# Patient Record
Sex: Female | Born: 1935 | Race: White | Hispanic: No | Smoking: Never smoker
Health system: Southern US, Community
[De-identification: ages and names within clinical notes are randomized; demographics above are authoritative.]

## PROBLEM LIST (undated history)

## (undated) DIAGNOSIS — Z9221 Personal history of antineoplastic chemotherapy: Secondary | ICD-10-CM

## (undated) DIAGNOSIS — Z923 Personal history of irradiation: Secondary | ICD-10-CM

## (undated) DIAGNOSIS — I10 Essential (primary) hypertension: Secondary | ICD-10-CM

## (undated) DIAGNOSIS — F03A0 Unspecified dementia, mild, without behavioral disturbance, psychotic disturbance, mood disturbance, and anxiety: Secondary | ICD-10-CM

## (undated) DIAGNOSIS — F0390 Unspecified dementia without behavioral disturbance: Secondary | ICD-10-CM

## (undated) DIAGNOSIS — E049 Nontoxic goiter, unspecified: Secondary | ICD-10-CM

## (undated) DIAGNOSIS — I4891 Unspecified atrial fibrillation: Secondary | ICD-10-CM

## (undated) DIAGNOSIS — C50919 Malignant neoplasm of unspecified site of unspecified female breast: Secondary | ICD-10-CM

## (undated) DIAGNOSIS — C801 Malignant (primary) neoplasm, unspecified: Secondary | ICD-10-CM

## (undated) DIAGNOSIS — I509 Heart failure, unspecified: Secondary | ICD-10-CM

## (undated) HISTORY — DX: Unspecified dementia, mild, without behavioral disturbance, psychotic disturbance, mood disturbance, and anxiety: F03.A0

## (undated) HISTORY — DX: Unspecified dementia without behavioral disturbance: F03.90

## (undated) HISTORY — DX: Heart failure, unspecified: I50.9

## (undated) HISTORY — PX: BREAST SURGERY: SHX581

## (undated) HISTORY — DX: Nontoxic goiter, unspecified: E04.9

---

## 1975-01-22 HISTORY — PX: THYROID SURGERY: SHX805

## 2009-05-10 ENCOUNTER — Inpatient Hospital Stay: Payer: Self-pay | Admitting: Internal Medicine

## 2011-11-22 ENCOUNTER — Ambulatory Visit: Payer: Self-pay | Admitting: Oncology

## 2011-12-08 ENCOUNTER — Inpatient Hospital Stay: Payer: Self-pay | Admitting: Internal Medicine

## 2011-12-08 LAB — BODY FLUID CELL COUNT WITH DIFFERENTIAL
Eosinophil: 0 %
Lymphocytes: 77 %
Neutrophils: 20 %
Other Mononuclear Cells: 3 %

## 2011-12-08 LAB — PROTEIN, BODY FLUID: Protein, Body Fluid: 2.4 g/dL

## 2011-12-08 LAB — LACTATE DEHYDROGENASE, PLEURAL OR PERITONEAL FLUID: LDH, Body Fluid: 47 U/L

## 2011-12-08 LAB — BASIC METABOLIC PANEL
BUN: 11 mg/dL (ref 7–18)
Calcium, Total: 8.8 mg/dL (ref 8.5–10.1)
Co2: 28 mmol/L (ref 21–32)
Creatinine: 0.77 mg/dL (ref 0.60–1.30)
EGFR (Non-African Amer.): >60
Glucose: 127 mg/dL — ABNORMAL HIGH (ref 65–99)
Potassium: 3.8 mmol/L (ref 3.5–5.1)
Sodium: 127 mmol/L — ABNORMAL LOW (ref 136–145)

## 2011-12-08 LAB — CK TOTAL AND CKMB (NOT AT ARMC): CK, Total: 51 U/L (ref 21–215)

## 2011-12-08 LAB — TSH: Thyroid Stimulating Horm: 2.11 u[IU]/mL

## 2011-12-08 LAB — HEMOGLOBIN A1C: Hemoglobin A1C: 5.5 % (ref 4.2–6.3)

## 2011-12-08 LAB — CBC
HCT: 42.7 % (ref 35.0–47.0)
HGB: 14.6 g/dL (ref 12.0–16.0)
RBC: 4.65 10*6/uL (ref 3.80–5.20)
RDW: 13.1 % (ref 11.5–14.5)
WBC: 9.3 10*3/uL (ref 3.6–11.0)

## 2011-12-08 LAB — GLUCOSE, SEROUS FLUID: Glucose, Body Fluid: 123 mg/dL

## 2011-12-08 LAB — PRO B NATRIURETIC PEPTIDE: B-Type Natriuretic Peptide: 2681 pg/mL — ABNORMAL HIGH (ref 0–450)

## 2011-12-08 LAB — ALBUMIN, FLUID (OTHER): Body Fluid Albumin: 1.9 g/dL

## 2011-12-08 LAB — URIC ACID: Uric Acid: 3.4 mg/dL (ref 2.6–6.0)

## 2011-12-08 LAB — TROPONIN I: Troponin-I: <0.02 ng/mL

## 2011-12-09 LAB — CBC WITH DIFFERENTIAL/PLATELET
Basophil #: 0.1 10*3/uL (ref 0.0–0.1)
Eosinophil #: 0.1 10*3/uL (ref 0.0–0.7)
HCT: 39.2 % (ref 35.0–47.0)
HGB: 13.6 g/dL (ref 12.0–16.0)
Lymphocyte #: 0.9 10*3/uL — ABNORMAL LOW (ref 1.0–3.6)
Lymphocyte %: 12.9 %
MCHC: 34.6 g/dL (ref 32.0–36.0)
MCV: 91 fL (ref 80–100)
Monocyte #: 0.7 x10 3/mm (ref 0.2–0.9)
Monocyte %: 10.2 %
Neutrophil #: 5.1 10*3/uL (ref 1.4–6.5)
RBC: 4.32 10*6/uL (ref 3.80–5.20)
RDW: 13.1 % (ref 11.5–14.5)
WBC: 6.8 10*3/uL (ref 3.6–11.0)

## 2011-12-09 LAB — PROTIME-INR
INR: 1.1
Prothrombin Time: 14.1 secs (ref 11.5–14.7)

## 2011-12-09 LAB — LIPID PANEL
HDL Cholesterol: 52 mg/dL (ref 40–60)
Ldl Cholesterol, Calc: 97 mg/dL (ref 0–100)
Triglycerides: 85 mg/dL (ref 0–200)
VLDL Cholesterol, Calc: 17 mg/dL (ref 5–40)

## 2011-12-09 LAB — COMPREHENSIVE METABOLIC PANEL
Anion Gap: 6 — ABNORMAL LOW (ref 7–16)
Calcium, Total: 8.5 mg/dL (ref 8.5–10.1)
Chloride: 95 mmol/L — ABNORMAL LOW (ref 98–107)
Co2: 31 mmol/L (ref 21–32)
Creatinine: 0.87 mg/dL (ref 0.60–1.30)
EGFR (African American): >60
EGFR (Non-African Amer.): >60
Osmolality: 265 (ref 275–301)
SGOT(AST): 20 U/L (ref 15–37)
SGPT (ALT): 28 U/L (ref 12–78)
Sodium: 132 mmol/L — ABNORMAL LOW (ref 136–145)

## 2011-12-09 LAB — TROPONIN I: Troponin-I: <0.02 ng/mL

## 2011-12-09 LAB — HEMOGLOBIN A1C: Hemoglobin A1C: 5.5 % (ref 4.2–6.3)

## 2011-12-10 LAB — SODIUM, URINE, RANDOM: Sodium, Urine Random: 26 mmol/L (ref 20–110)

## 2011-12-10 LAB — OSMOLALITY, URINE: Osmolality: 203 mOsm/kg

## 2011-12-11 LAB — BASIC METABOLIC PANEL
Anion Gap: 3 — ABNORMAL LOW (ref 7–16)
BUN: 9 mg/dL (ref 7–18)
Calcium, Total: 8.7 mg/dL (ref 8.5–10.1)
Chloride: 98 mmol/L (ref 98–107)
Creatinine: 0.72 mg/dL (ref 0.60–1.30)
EGFR (African American): >60
EGFR (Non-African Amer.): >60
Glucose: 101 mg/dL — ABNORMAL HIGH (ref 65–99)
Osmolality: 267 (ref 275–301)
Potassium: 4.9 mmol/L (ref 3.5–5.1)

## 2011-12-12 LAB — BODY FLUID CULTURE

## 2011-12-16 ENCOUNTER — Ambulatory Visit: Payer: Self-pay

## 2011-12-16 ENCOUNTER — Ambulatory Visit: Payer: Self-pay | Admitting: Cardiothoracic Surgery

## 2011-12-16 LAB — CBC WITH DIFFERENTIAL/PLATELET
Basophil #: 0.1 10*3/uL (ref 0.0–0.1)
Eosinophil #: 0.1 10*3/uL (ref 0.0–0.7)
Eosinophil %: 0.8 %
HGB: 14 g/dL (ref 12.0–16.0)
Lymphocyte %: 12.3 %
MCH: 31 pg (ref 26.0–34.0)
MCHC: 33.7 g/dL (ref 32.0–36.0)
Monocyte %: 7.1 %
Neutrophil %: 79 %
Platelet: 190 10*3/uL (ref 150–440)
RBC: 4.53 10*6/uL (ref 3.80–5.20)
RDW: 13.2 % (ref 11.5–14.5)
WBC: 7 10*3/uL (ref 3.6–11.0)

## 2011-12-16 LAB — COMPREHENSIVE METABOLIC PANEL
Albumin: 4 g/dL (ref 3.4–5.0)
Alkaline Phosphatase: 107 U/L (ref 50–136)
Anion Gap: 5 — ABNORMAL LOW (ref 7–16)
BUN: 8 mg/dL (ref 7–18)
Bilirubin,Total: 0.6 mg/dL (ref 0.2–1.0)
Calcium, Total: 9.2 mg/dL (ref 8.5–10.1)
Chloride: 96 mmol/L — ABNORMAL LOW (ref 98–107)
Co2: 31 mmol/L (ref 21–32)
Creatinine: 0.66 mg/dL (ref 0.60–1.30)
EGFR (African American): >60
EGFR (Non-African Amer.): >60
Glucose: 102 mg/dL — ABNORMAL HIGH (ref 65–99)
Osmolality: 263 (ref 275–301)
Potassium: 4.6 mmol/L (ref 3.5–5.1)
SGOT(AST): 17 U/L (ref 15–37)
SGPT (ALT): 20 U/L (ref 12–78)
Sodium: 132 mmol/L — ABNORMAL LOW (ref 136–145)
Total Protein: 6.7 g/dL (ref 6.4–8.2)

## 2011-12-16 LAB — PROTIME-INR
INR: 1
Prothrombin Time: 13.2 secs (ref 11.5–14.7)

## 2011-12-16 LAB — APTT: Activated PTT: 27.2 secs (ref 23.6–35.9)

## 2011-12-17 ENCOUNTER — Emergency Department: Payer: Self-pay | Admitting: Emergency Medicine

## 2011-12-17 LAB — COMPREHENSIVE METABOLIC PANEL
Albumin: 3.6 g/dL (ref 3.4–5.0)
Alkaline Phosphatase: 93 U/L (ref 50–136)
Calcium, Total: 8.7 mg/dL (ref 8.5–10.1)
Co2: 28 mmol/L (ref 21–32)
Creatinine: 0.53 mg/dL — ABNORMAL LOW (ref 0.60–1.30)
EGFR (African American): >60
EGFR (Non-African Amer.): >60
Glucose: 106 mg/dL — ABNORMAL HIGH (ref 65–99)
Osmolality: 268 (ref 275–301)
Sodium: 133 mmol/L — ABNORMAL LOW (ref 136–145)

## 2011-12-17 LAB — CBC
HGB: 13.2 g/dL (ref 12.0–16.0)
MCHC: 34.2 g/dL (ref 32.0–36.0)
MCV: 91 fL (ref 80–100)
RBC: 4.22 10*6/uL (ref 3.80–5.20)
WBC: 7.7 10*3/uL (ref 3.6–11.0)

## 2011-12-17 LAB — CK TOTAL AND CKMB (NOT AT ARMC)
CK, Total: 97 U/L (ref 21–215)
CK, Total: 27 U/L (ref 21–215)
CK-MB: 0.5 ng/mL (ref 0.5–3.6)
CK-MB: 0.6 ng/mL (ref 0.5–3.6)

## 2011-12-17 LAB — TROPONIN I: Troponin-I: <0.02 ng/mL

## 2011-12-22 ENCOUNTER — Ambulatory Visit: Payer: Self-pay | Admitting: Oncology

## 2011-12-23 ENCOUNTER — Ambulatory Visit: Payer: Self-pay | Admitting: Oncology

## 2011-12-25 ENCOUNTER — Ambulatory Visit: Payer: Self-pay | Admitting: Oncology

## 2011-12-25 LAB — PATHOLOGY REPORT

## 2012-01-01 ENCOUNTER — Inpatient Hospital Stay: Payer: Self-pay

## 2012-01-02 LAB — BASIC METABOLIC PANEL
Anion Gap: 5 — ABNORMAL LOW (ref 7–16)
BUN: 13 mg/dL (ref 7–18)
Co2: 31 mmol/L (ref 21–32)
Creatinine: 0.81 mg/dL (ref 0.60–1.30)
Osmolality: 271 (ref 275–301)
Sodium: 134 mmol/L — ABNORMAL LOW (ref 136–145)

## 2012-01-02 LAB — CBC WITH DIFFERENTIAL/PLATELET
Eosinophil #: 0 10*3/uL (ref 0.0–0.7)
Eosinophil %: 0.3 %
HCT: 35.9 % (ref 35.0–47.0)
Lymphocyte #: 0.9 10*3/uL — ABNORMAL LOW (ref 1.0–3.6)
MCH: 31.5 pg (ref 26.0–34.0)
MCHC: 33.9 g/dL (ref 32.0–36.0)
MCV: 93 fL (ref 80–100)
Monocyte #: 1.2 x10 3/mm — ABNORMAL HIGH (ref 0.2–0.9)
Monocyte %: 11.2 %
Neutrophil #: 8.3 10*3/uL — ABNORMAL HIGH (ref 1.4–6.5)
Neutrophil %: 79.5 %
Platelet: 171 10*3/uL (ref 150–440)
RBC: 3.87 10*6/uL (ref 3.80–5.20)
RDW: 12.7 % (ref 11.5–14.5)

## 2012-01-02 LAB — PATHOLOGY REPORT

## 2012-01-22 ENCOUNTER — Ambulatory Visit: Payer: Self-pay | Admitting: Oncology

## 2012-02-12 LAB — COMPREHENSIVE METABOLIC PANEL
Anion Gap: 10 (ref 7–16)
Bilirubin,Total: 0.4 mg/dL (ref 0.2–1.0)
Chloride: 99 mmol/L (ref 98–107)
Co2: 27 mmol/L (ref 21–32)
Creatinine: 0.89 mg/dL (ref 0.60–1.30)
EGFR (African American): >60
EGFR (Non-African Amer.): >60
Glucose: 142 mg/dL — ABNORMAL HIGH (ref 65–99)
Potassium: 3.9 mmol/L (ref 3.5–5.1)
SGOT(AST): 16 U/L (ref 15–37)
SGPT (ALT): 15 U/L (ref 12–78)

## 2012-02-12 LAB — CBC CANCER CENTER
Basophil #: 0.1 x10 3/mm (ref 0.0–0.1)
Eosinophil #: 0.1 x10 3/mm (ref 0.0–0.7)
HCT: 36.7 % (ref 35.0–47.0)
Lymphocyte %: 12.3 %
MCH: 30.1 pg (ref 26.0–34.0)
MCHC: 33.3 g/dL (ref 32.0–36.0)
Monocyte #: 0.5 x10 3/mm (ref 0.2–0.9)
Neutrophil #: 5 x10 3/mm (ref 1.4–6.5)
Neutrophil %: 77.6 %
RBC: 4.06 10*6/uL (ref 3.80–5.20)
RDW: 13.5 % (ref 11.5–14.5)
WBC: 6.5 x10 3/mm (ref 3.6–11.0)

## 2012-02-19 LAB — COMPREHENSIVE METABOLIC PANEL
Albumin: 3.2 g/dL — ABNORMAL LOW (ref 3.4–5.0)
Alkaline Phosphatase: 97 U/L (ref 50–136)
Anion Gap: 6 — ABNORMAL LOW (ref 7–16)
Bilirubin,Total: 0.4 mg/dL (ref 0.2–1.0)
Chloride: 98 mmol/L (ref 98–107)
EGFR (Non-African Amer.): >60
Glucose: 96 mg/dL (ref 65–99)
Potassium: 4.4 mmol/L (ref 3.5–5.1)
SGPT (ALT): 15 U/L (ref 12–78)
Sodium: 134 mmol/L — ABNORMAL LOW (ref 136–145)
Total Protein: 5.9 g/dL — ABNORMAL LOW (ref 6.4–8.2)

## 2012-02-19 LAB — CBC CANCER CENTER
Basophil #: 0 x10 3/mm (ref 0.0–0.1)
Basophil %: 6.8 %
Eosinophil #: 0.1 x10 3/mm (ref 0.0–0.7)
Eosinophil %: 7.4 %
HCT: 32.9 % — ABNORMAL LOW (ref 35.0–47.0)
Lymphocyte #: 0.4 x10 3/mm — ABNORMAL LOW (ref 1.0–3.6)
Lymphocyte %: 62 %
MCH: 30.5 pg (ref 26.0–34.0)
MCHC: 33.8 g/dL (ref 32.0–36.0)
Monocyte #: 0.1 x10 3/mm — ABNORMAL LOW (ref 0.2–0.9)
Neutrophil %: 13.4 %
Platelet: 78 x10 3/mm — ABNORMAL LOW (ref 150–440)
RBC: 3.65 10*6/uL — ABNORMAL LOW (ref 3.80–5.20)
RDW: 12.6 % (ref 11.5–14.5)
WBC: 0.7 x10 3/mm — CL (ref 3.6–11.0)

## 2012-02-22 ENCOUNTER — Ambulatory Visit: Payer: Self-pay | Admitting: Oncology

## 2012-02-26 LAB — CBC CANCER CENTER
Basophil %: 0.4 %
Eosinophil #: 0 x10 3/mm (ref 0.0–0.7)
Eosinophil %: 0.1 %
HGB: 10.8 g/dL — ABNORMAL LOW (ref 12.0–16.0)
Lymphocyte #: 0.7 x10 3/mm — ABNORMAL LOW (ref 1.0–3.6)
MCHC: 32.7 g/dL (ref 32.0–36.0)
Monocyte #: 1 x10 3/mm — ABNORMAL HIGH (ref 0.2–0.9)
Monocyte %: 7.1 %
Neutrophil %: 87.7 %
RBC: 3.63 10*6/uL — ABNORMAL LOW (ref 3.80–5.20)
RDW: 13.5 % (ref 11.5–14.5)
WBC: 14.3 x10 3/mm — ABNORMAL HIGH (ref 3.6–11.0)

## 2012-02-26 LAB — COMPREHENSIVE METABOLIC PANEL
Albumin: 3.2 g/dL — ABNORMAL LOW (ref 3.4–5.0)
Alkaline Phosphatase: 103 U/L (ref 50–136)
Anion Gap: 11 (ref 7–16)
Bilirubin,Total: 0.2 mg/dL (ref 0.2–1.0)
Calcium, Total: 8.2 mg/dL — ABNORMAL LOW (ref 8.5–10.1)
Creatinine: 0.89 mg/dL (ref 0.60–1.30)
EGFR (African American): >60
EGFR (Non-African Amer.): >60
Glucose: 145 mg/dL — ABNORMAL HIGH (ref 65–99)
SGOT(AST): 17 U/L (ref 15–37)
SGPT (ALT): 14 U/L (ref 12–78)
Sodium: 136 mmol/L (ref 136–145)

## 2012-03-04 LAB — CBC CANCER CENTER
Bands: 3 %
HCT: 30.5 % — ABNORMAL LOW (ref 35.0–47.0)
HGB: 10.4 g/dL — ABNORMAL LOW (ref 12.0–16.0)
MCH: 30.9 pg (ref 26.0–34.0)
MCV: 91 fL (ref 80–100)
Monocytes: 4 %
Other Cells Blood: 1 %
RBC: 3.36 10*6/uL — ABNORMAL LOW (ref 3.80–5.20)

## 2012-03-11 LAB — CBC CANCER CENTER
Basophil #: 0 x10 3/mm (ref 0.0–0.1)
Basophil %: 0.2 %
Eosinophil %: 0.3 %
HCT: 29.8 % — ABNORMAL LOW (ref 35.0–47.0)
HGB: 10 g/dL — ABNORMAL LOW (ref 12.0–16.0)
Lymphocyte #: 0.6 x10 3/mm — ABNORMAL LOW (ref 1.0–3.6)
Lymphocyte %: 3.3 %
MCV: 91 fL (ref 80–100)
Monocyte #: 1.1 x10 3/mm — ABNORMAL HIGH (ref 0.2–0.9)
Neutrophil #: 15.2 x10 3/mm — ABNORMAL HIGH (ref 1.4–6.5)
Neutrophil %: 89.7 %
RBC: 3.29 10*6/uL — ABNORMAL LOW (ref 3.80–5.20)
RDW: 14.4 % (ref 11.5–14.5)

## 2012-03-11 LAB — COMPREHENSIVE METABOLIC PANEL
Anion Gap: 9 (ref 7–16)
Bilirubin,Total: 0.1 mg/dL — ABNORMAL LOW (ref 0.2–1.0)
Calcium, Total: 8.5 mg/dL (ref 8.5–10.1)
Chloride: 100 mmol/L (ref 98–107)
Creatinine: 0.88 mg/dL (ref 0.60–1.30)
EGFR (African American): >60
Glucose: 130 mg/dL — ABNORMAL HIGH (ref 65–99)
Potassium: 3.8 mmol/L (ref 3.5–5.1)
SGPT (ALT): 14 U/L (ref 12–78)
Total Protein: 5.9 g/dL — ABNORMAL LOW (ref 6.4–8.2)

## 2012-03-18 LAB — CBC CANCER CENTER
Basophil #: 0 x10 3/mm (ref 0.0–0.1)
Eosinophil #: 0 x10 3/mm (ref 0.0–0.7)
HCT: 26.8 % — ABNORMAL LOW (ref 35.0–47.0)
HGB: 9.1 g/dL — ABNORMAL LOW (ref 12.0–16.0)
Lymphocyte #: 0.1 x10 3/mm — ABNORMAL LOW (ref 1.0–3.6)
MCH: 31.2 pg (ref 26.0–34.0)
MCHC: 34.1 g/dL (ref 32.0–36.0)
Monocyte #: 0.1 x10 3/mm — ABNORMAL LOW (ref 0.2–0.9)
Monocyte %: 22.8 %
RBC: 2.93 10*6/uL — ABNORMAL LOW (ref 3.80–5.20)
RDW: 15.2 % — ABNORMAL HIGH (ref 11.5–14.5)

## 2012-03-21 ENCOUNTER — Ambulatory Visit: Payer: Self-pay | Admitting: Oncology

## 2012-03-25 LAB — CBC CANCER CENTER
Basophil #: 0.1 x10 3/mm (ref 0.0–0.1)
Basophil %: 0.4 %
Eosinophil #: 0 x10 3/mm (ref 0.0–0.7)
HCT: 27.8 % — ABNORMAL LOW (ref 35.0–47.0)
Lymphocyte #: 0.3 x10 3/mm — ABNORMAL LOW (ref 1.0–3.6)
Lymphocyte %: 1.5 %
MCHC: 33.9 g/dL (ref 32.0–36.0)
Monocyte #: 1.2 x10 3/mm — ABNORMAL HIGH (ref 0.2–0.9)
Neutrophil #: 19.3 x10 3/mm — ABNORMAL HIGH (ref 1.4–6.5)

## 2012-03-25 LAB — COMPREHENSIVE METABOLIC PANEL
Alkaline Phosphatase: 95 U/L (ref 50–136)
Anion Gap: 10 (ref 7–16)
Calcium, Total: 8.5 mg/dL (ref 8.5–10.1)
Chloride: 97 mmol/L — ABNORMAL LOW (ref 98–107)
Creatinine: 0.91 mg/dL (ref 0.60–1.30)
EGFR (African American): >60
EGFR (Non-African Amer.): >60
Osmolality: 272 (ref 275–301)
Potassium: 3.7 mmol/L (ref 3.5–5.1)
Sodium: 136 mmol/L (ref 136–145)
Total Protein: 5.9 g/dL — ABNORMAL LOW (ref 6.4–8.2)

## 2012-04-15 LAB — CBC CANCER CENTER
Basophil #: 0.1 x10 3/mm (ref 0.0–0.1)
Basophil %: 1 %
Eosinophil #: 0 x10 3/mm (ref 0.0–0.7)
Lymphocyte %: 2.3 %
MCV: 93 fL (ref 80–100)
Monocyte %: 11 %
Neutrophil #: 10 x10 3/mm — ABNORMAL HIGH (ref 1.4–6.5)
Platelet: 209 x10 3/mm (ref 150–440)
RBC: 2.61 10*6/uL — ABNORMAL LOW (ref 3.80–5.20)
RDW: 19.9 % — ABNORMAL HIGH (ref 11.5–14.5)
WBC: 11.6 x10 3/mm — ABNORMAL HIGH (ref 3.6–11.0)

## 2012-04-15 LAB — COMPREHENSIVE METABOLIC PANEL
Alkaline Phosphatase: 74 U/L (ref 50–136)
Anion Gap: 10 (ref 7–16)
BUN: 14 mg/dL (ref 7–18)
Calcium, Total: 8.4 mg/dL — ABNORMAL LOW (ref 8.5–10.1)
Chloride: 96 mmol/L — ABNORMAL LOW (ref 98–107)
Co2: 27 mmol/L (ref 21–32)
Creatinine: 0.87 mg/dL (ref 0.60–1.30)
EGFR (Non-African Amer.): >60
Glucose: 126 mg/dL — ABNORMAL HIGH (ref 65–99)
Osmolality: 268 (ref 275–301)
Potassium: 4.3 mmol/L (ref 3.5–5.1)
SGOT(AST): 46 U/L — ABNORMAL HIGH (ref 15–37)
SGPT (ALT): 60 U/L (ref 12–78)
Total Protein: 5.5 g/dL — ABNORMAL LOW (ref 6.4–8.2)

## 2012-04-21 ENCOUNTER — Ambulatory Visit: Payer: Self-pay | Admitting: Oncology

## 2012-04-22 LAB — COMPREHENSIVE METABOLIC PANEL
BUN: 12 mg/dL (ref 7–18)
Creatinine: 0.79 mg/dL (ref 0.60–1.30)
EGFR (Non-African Amer.): >60
Glucose: 118 mg/dL — ABNORMAL HIGH (ref 65–99)
Osmolality: 269 (ref 275–301)
SGOT(AST): 21 U/L (ref 15–37)
SGPT (ALT): 27 U/L (ref 12–78)
Sodium: 134 mmol/L — ABNORMAL LOW (ref 136–145)

## 2012-04-22 LAB — CBC CANCER CENTER
Basophil %: 1.3 %
Eosinophil #: 0.1 x10 3/mm (ref 0.0–0.7)
Eosinophil %: 1.4 %
HCT: 27.6 % — ABNORMAL LOW (ref 35.0–47.0)
HGB: 9.2 g/dL — ABNORMAL LOW (ref 12.0–16.0)
MCH: 31 pg (ref 26.0–34.0)
MCV: 93 fL (ref 80–100)
Monocyte %: 8.5 %
Neutrophil #: 7.5 x10 3/mm — ABNORMAL HIGH (ref 1.4–6.5)
Neutrophil %: 86.2 %
RBC: 2.96 10*6/uL — ABNORMAL LOW (ref 3.80–5.20)
RDW: 18.7 % — ABNORMAL HIGH (ref 11.5–14.5)

## 2012-04-29 LAB — COMPREHENSIVE METABOLIC PANEL
Albumin: 2.8 g/dL — ABNORMAL LOW (ref 3.4–5.0)
Alkaline Phosphatase: 61 U/L (ref 50–136)
Anion Gap: 12 (ref 7–16)
Bilirubin,Total: 0.7 mg/dL (ref 0.2–1.0)
Calcium, Total: 8.2 mg/dL — ABNORMAL LOW (ref 8.5–10.1)
EGFR (African American): >60
Osmolality: 273 (ref 275–301)
Potassium: 3.6 mmol/L (ref 3.5–5.1)
SGPT (ALT): 19 U/L (ref 12–78)
Total Protein: 5.5 g/dL — ABNORMAL LOW (ref 6.4–8.2)

## 2012-04-29 LAB — CBC CANCER CENTER
Basophil #: 0.1 x10 3/mm (ref 0.0–0.1)
Basophil %: 1.3 %
Eosinophil #: 0.2 x10 3/mm (ref 0.0–0.7)
HCT: 27.1 % — ABNORMAL LOW (ref 35.0–47.0)
HGB: 9.2 g/dL — ABNORMAL LOW (ref 12.0–16.0)
MCHC: 33.8 g/dL (ref 32.0–36.0)
MCV: 94 fL (ref 80–100)
Monocyte #: 0.7 x10 3/mm (ref 0.2–0.9)
Neutrophil %: 78.1 %
Platelet: 154 x10 3/mm (ref 150–440)
RBC: 2.89 10*6/uL — ABNORMAL LOW (ref 3.80–5.20)
RDW: 19.3 % — ABNORMAL HIGH (ref 11.5–14.5)
WBC: 6 x10 3/mm (ref 3.6–11.0)

## 2012-05-06 LAB — COMPREHENSIVE METABOLIC PANEL
Albumin: 2.9 g/dL — ABNORMAL LOW (ref 3.4–5.0)
Alkaline Phosphatase: 59 U/L (ref 50–136)
BUN: 10 mg/dL (ref 7–18)
Bilirubin,Total: 0.6 mg/dL (ref 0.2–1.0)
Calcium, Total: 8.4 mg/dL — ABNORMAL LOW (ref 8.5–10.1)
Chloride: 96 mmol/L — ABNORMAL LOW (ref 98–107)
Creatinine: 0.78 mg/dL (ref 0.60–1.30)
EGFR (African American): >60
Osmolality: 270 (ref 275–301)
Potassium: 3.4 mmol/L — ABNORMAL LOW (ref 3.5–5.1)
SGOT(AST): 22 U/L (ref 15–37)
SGPT (ALT): 20 U/L (ref 12–78)
Sodium: 135 mmol/L — ABNORMAL LOW (ref 136–145)
Total Protein: 5.6 g/dL — ABNORMAL LOW (ref 6.4–8.2)

## 2012-05-06 LAB — CBC CANCER CENTER
Basophil #: 0.1 x10 3/mm (ref 0.0–0.1)
Basophil %: 1.4 %
HCT: 26 % — ABNORMAL LOW (ref 35.0–47.0)
Lymphocyte #: 0.2 x10 3/mm — ABNORMAL LOW (ref 1.0–3.6)
Lymphocyte %: 4.3 %
MCH: 32 pg (ref 26.0–34.0)
MCHC: 33.6 g/dL (ref 32.0–36.0)
MCV: 95 fL (ref 80–100)
Monocyte #: 0.5 x10 3/mm (ref 0.2–0.9)
Monocyte %: 11.6 %
Platelet: 116 x10 3/mm — ABNORMAL LOW (ref 150–440)
RDW: 20 % — ABNORMAL HIGH (ref 11.5–14.5)
WBC: 4.5 x10 3/mm (ref 3.6–11.0)

## 2012-05-13 LAB — CBC CANCER CENTER
Basophil #: 0.1 x10 3/mm (ref 0.0–0.1)
Eosinophil #: 0.1 x10 3/mm (ref 0.0–0.7)
Eosinophil %: 1.5 %
HCT: 25.8 % — ABNORMAL LOW (ref 35.0–47.0)
HGB: 8.7 g/dL — ABNORMAL LOW (ref 12.0–16.0)
MCHC: 33.5 g/dL (ref 32.0–36.0)
MCV: 95 fL (ref 80–100)
Monocyte #: 0.5 x10 3/mm (ref 0.2–0.9)
Neutrophil #: 3.1 x10 3/mm (ref 1.4–6.5)
Neutrophil %: 77.4 %
Platelet: 138 x10 3/mm — ABNORMAL LOW (ref 150–440)
RBC: 2.71 10*6/uL — ABNORMAL LOW (ref 3.80–5.20)
WBC: 4 x10 3/mm (ref 3.6–11.0)

## 2012-05-13 LAB — COMPREHENSIVE METABOLIC PANEL
Albumin: 2.7 g/dL — ABNORMAL LOW (ref 3.4–5.0)
Alkaline Phosphatase: 56 U/L (ref 50–136)
Bilirubin,Total: 0.5 mg/dL (ref 0.2–1.0)
Chloride: 96 mmol/L — ABNORMAL LOW (ref 98–107)
Co2: 33 mmol/L — ABNORMAL HIGH (ref 21–32)
EGFR (African American): >60
EGFR (Non-African Amer.): >60
Glucose: 135 mg/dL — ABNORMAL HIGH (ref 65–99)
Potassium: 3.9 mmol/L (ref 3.5–5.1)
Sodium: 133 mmol/L — ABNORMAL LOW (ref 136–145)
Total Protein: 5.5 g/dL — ABNORMAL LOW (ref 6.4–8.2)

## 2012-05-20 LAB — CBC CANCER CENTER
Basophil %: 1.4 %
Eosinophil #: 0 x10 3/mm (ref 0.0–0.7)
Eosinophil %: 1.2 %
HGB: 8.9 g/dL — ABNORMAL LOW (ref 12.0–16.0)
Lymphocyte #: 0.3 x10 3/mm — ABNORMAL LOW (ref 1.0–3.6)
Lymphocyte %: 8.2 %
Monocyte #: 0.4 x10 3/mm (ref 0.2–0.9)
RDW: 20.1 % — ABNORMAL HIGH (ref 11.5–14.5)
WBC: 3.2 x10 3/mm — ABNORMAL LOW (ref 3.6–11.0)

## 2012-05-20 LAB — COMPREHENSIVE METABOLIC PANEL
Albumin: 3 g/dL — ABNORMAL LOW (ref 3.4–5.0)
Alkaline Phosphatase: 55 U/L (ref 50–136)
Anion Gap: 7 (ref 7–16)
Calcium, Total: 8.6 mg/dL (ref 8.5–10.1)
Chloride: 97 mmol/L — ABNORMAL LOW (ref 98–107)
Co2: 33 mmol/L — ABNORMAL HIGH (ref 21–32)
Glucose: 127 mg/dL — ABNORMAL HIGH (ref 65–99)
Osmolality: 275 (ref 275–301)
Potassium: 4 mmol/L (ref 3.5–5.1)
SGOT(AST): 23 U/L (ref 15–37)

## 2012-05-21 ENCOUNTER — Ambulatory Visit: Payer: Self-pay | Admitting: Oncology

## 2012-05-27 LAB — CBC CANCER CENTER
Basophil %: 1.3 %
Eosinophil #: 0 x10 3/mm (ref 0.0–0.7)
Eosinophil %: 1.2 %
HGB: 8.4 g/dL — ABNORMAL LOW (ref 12.0–16.0)
Lymphocyte #: 0.4 x10 3/mm — ABNORMAL LOW (ref 1.0–3.6)
MCH: 32.1 pg (ref 26.0–34.0)
Monocyte #: 0.5 x10 3/mm (ref 0.2–0.9)
Monocyte %: 11.7 %
Neutrophil %: 76.3 %
Platelet: 161 x10 3/mm (ref 150–440)
RDW: 20.1 % — ABNORMAL HIGH (ref 11.5–14.5)

## 2012-05-27 LAB — COMPREHENSIVE METABOLIC PANEL
Alkaline Phosphatase: 50 U/L (ref 50–136)
Anion Gap: 6 — ABNORMAL LOW (ref 7–16)
Bilirubin,Total: 0.5 mg/dL (ref 0.2–1.0)
Calcium, Total: 8.7 mg/dL (ref 8.5–10.1)
Chloride: 98 mmol/L (ref 98–107)
Co2: 32 mmol/L (ref 21–32)
Creatinine: 0.8 mg/dL (ref 0.60–1.30)
EGFR (African American): >60
EGFR (Non-African Amer.): >60
Glucose: 132 mg/dL — ABNORMAL HIGH (ref 65–99)
Sodium: 136 mmol/L (ref 136–145)

## 2012-06-03 LAB — CBC CANCER CENTER
Basophil #: 0 x10 3/mm (ref 0.0–0.1)
Basophil %: 0.4 %
HCT: 25.2 % — ABNORMAL LOW (ref 35.0–47.0)
Lymphocyte #: 0.5 x10 3/mm — ABNORMAL LOW (ref 1.0–3.6)
MCHC: 32.6 g/dL (ref 32.0–36.0)
MCV: 96 fL (ref 80–100)
Monocyte #: 0.5 x10 3/mm (ref 0.2–0.9)
Monocyte %: 12.6 %
Platelet: 167 x10 3/mm (ref 150–440)
RDW: 20.4 % — ABNORMAL HIGH (ref 11.5–14.5)
WBC: 3.7 x10 3/mm (ref 3.6–11.0)

## 2012-06-03 LAB — COMPREHENSIVE METABOLIC PANEL
Albumin: 3 g/dL — ABNORMAL LOW (ref 3.4–5.0)
Alkaline Phosphatase: 57 U/L (ref 50–136)
Anion Gap: 8 (ref 7–16)
BUN: 8 mg/dL (ref 7–18)
Bilirubin,Total: 0.4 mg/dL (ref 0.2–1.0)
Calcium, Total: 8.5 mg/dL (ref 8.5–10.1)
Chloride: 99 mmol/L (ref 98–107)
Co2: 32 mmol/L (ref 21–32)
Creatinine: 0.75 mg/dL (ref 0.60–1.30)
EGFR (African American): >60
EGFR (Non-African Amer.): >60
Glucose: 119 mg/dL — ABNORMAL HIGH (ref 65–99)
Osmolality: 277 (ref 275–301)
Potassium: 3.5 mmol/L (ref 3.5–5.1)
SGOT(AST): 21 U/L (ref 15–37)
SGPT (ALT): 20 U/L (ref 12–78)
Sodium: 139 mmol/L (ref 136–145)
Total Protein: 5.6 g/dL — ABNORMAL LOW (ref 6.4–8.2)

## 2012-06-10 LAB — COMPREHENSIVE METABOLIC PANEL
Alkaline Phosphatase: 57 U/L (ref 50–136)
Anion Gap: 8 (ref 7–16)
Bilirubin,Total: 0.5 mg/dL (ref 0.2–1.0)
Calcium, Total: 8.5 mg/dL (ref 8.5–10.1)
Chloride: 97 mmol/L — ABNORMAL LOW (ref 98–107)
Creatinine: 0.73 mg/dL (ref 0.60–1.30)
EGFR (African American): >60
Potassium: 3.8 mmol/L (ref 3.5–5.1)
SGPT (ALT): 21 U/L (ref 12–78)
Sodium: 136 mmol/L (ref 136–145)

## 2012-06-10 LAB — CBC CANCER CENTER
Basophil #: 0 x10 3/mm (ref 0.0–0.1)
Eosinophil #: 0 x10 3/mm (ref 0.0–0.7)
Eosinophil %: 1.2 %
HCT: 24.9 % — ABNORMAL LOW (ref 35.0–47.0)
HGB: 8.3 g/dL — ABNORMAL LOW (ref 12.0–16.0)
Lymphocyte #: 1 x10 3/mm (ref 1.0–3.6)
Lymphocyte %: 23.6 %
Monocyte #: 0.4 x10 3/mm (ref 0.2–0.9)
Neutrophil #: 2.6 x10 3/mm (ref 1.4–6.5)
Neutrophil %: 63.2 %
RBC: 2.6 10*6/uL — ABNORMAL LOW (ref 3.80–5.20)
RDW: 20.9 % — ABNORMAL HIGH (ref 11.5–14.5)

## 2012-06-17 LAB — CBC CANCER CENTER
Eosinophil #: 0 x10 3/mm (ref 0.0–0.7)
Eosinophil %: 1.2 %
HGB: 8.4 g/dL — ABNORMAL LOW (ref 12.0–16.0)
Lymphocyte %: 17.3 %
MCHC: 33.6 g/dL (ref 32.0–36.0)
MCV: 96 fL (ref 80–100)
Monocyte #: 0.5 x10 3/mm (ref 0.2–0.9)
Neutrophil #: 2.9 x10 3/mm (ref 1.4–6.5)
Neutrophil %: 68.8 %
RDW: 20.9 % — ABNORMAL HIGH (ref 11.5–14.5)
WBC: 4.2 x10 3/mm (ref 3.6–11.0)

## 2012-06-17 LAB — COMPREHENSIVE METABOLIC PANEL
Albumin: 3 g/dL — ABNORMAL LOW (ref 3.4–5.0)
Alkaline Phosphatase: 51 U/L (ref 50–136)
BUN: 14 mg/dL (ref 7–18)
Bilirubin,Total: 0.5 mg/dL (ref 0.2–1.0)
Glucose: 117 mg/dL — ABNORMAL HIGH (ref 65–99)
Osmolality: 277 (ref 275–301)
Total Protein: 5.5 g/dL — ABNORMAL LOW (ref 6.4–8.2)

## 2012-06-21 ENCOUNTER — Ambulatory Visit: Payer: Self-pay | Admitting: Oncology

## 2012-06-24 LAB — CBC CANCER CENTER
Basophil #: 0.1 x10 3/mm (ref 0.0–0.1)
Eosinophil #: 0 x10 3/mm (ref 0.0–0.7)
Eosinophil %: 0.8 %
HCT: 25.4 % — ABNORMAL LOW (ref 35.0–47.0)
Lymphocyte %: 19 %
MCH: 32.2 pg (ref 26.0–34.0)
MCHC: 32.9 g/dL (ref 32.0–36.0)
Monocyte %: 12.3 %

## 2012-06-24 LAB — COMPREHENSIVE METABOLIC PANEL
Albumin: 3 g/dL — ABNORMAL LOW (ref 3.4–5.0)
Alkaline Phosphatase: 56 U/L (ref 50–136)
Anion Gap: 9 (ref 7–16)
Calcium, Total: 8.6 mg/dL (ref 8.5–10.1)
Creatinine: 0.72 mg/dL (ref 0.60–1.30)
EGFR (African American): >60
EGFR (Non-African Amer.): >60
Glucose: 116 mg/dL — ABNORMAL HIGH (ref 65–99)
Potassium: 4.2 mmol/L (ref 3.5–5.1)
SGOT(AST): 24 U/L (ref 15–37)
Sodium: 138 mmol/L (ref 136–145)

## 2012-07-01 LAB — COMPREHENSIVE METABOLIC PANEL
Anion Gap: 7 (ref 7–16)
BUN: 10 mg/dL (ref 7–18)
Bilirubin,Total: 0.4 mg/dL (ref 0.2–1.0)
EGFR (African American): >60
EGFR (Non-African Amer.): >60
Glucose: 113 mg/dL — ABNORMAL HIGH (ref 65–99)
SGPT (ALT): 20 U/L (ref 12–78)
Sodium: 139 mmol/L (ref 136–145)
Total Protein: 5.5 g/dL — ABNORMAL LOW (ref 6.4–8.2)

## 2012-07-01 LAB — CBC CANCER CENTER
Basophil #: 0.1 x10 3/mm (ref 0.0–0.1)
Basophil %: 1.4 %
Eosinophil #: 0 x10 3/mm (ref 0.0–0.7)
Eosinophil %: 1 %
HCT: 25.8 % — ABNORMAL LOW (ref 35.0–47.0)
HGB: 8.7 g/dL — ABNORMAL LOW (ref 12.0–16.0)
Lymphocyte #: 0.8 x10 3/mm — ABNORMAL LOW (ref 1.0–3.6)
Lymphocyte %: 16.3 %
MCH: 33.4 pg (ref 26.0–34.0)
Monocyte #: 0.6 x10 3/mm (ref 0.2–0.9)
Monocyte %: 10.7 %
Neutrophil #: 3.6 x10 3/mm (ref 1.4–6.5)
Neutrophil %: 70.6 %
Platelet: 180 x10 3/mm (ref 150–440)
RDW: 21.2 % — ABNORMAL HIGH (ref 11.5–14.5)
WBC: 5.2 x10 3/mm (ref 3.6–11.0)

## 2012-07-21 ENCOUNTER — Ambulatory Visit: Payer: Self-pay | Admitting: Oncology

## 2012-08-21 DIAGNOSIS — C50919 Malignant neoplasm of unspecified site of unspecified female breast: Secondary | ICD-10-CM

## 2012-08-21 HISTORY — DX: Malignant neoplasm of unspecified site of unspecified female breast: C50.919

## 2012-08-21 HISTORY — PX: MASTECTOMY: SHX3

## 2012-08-25 ENCOUNTER — Ambulatory Visit: Payer: Self-pay | Admitting: Surgery

## 2012-08-25 LAB — CBC WITH DIFFERENTIAL/PLATELET
Basophil %: 0.8 %
Eosinophil #: 0.1 10*3/uL (ref 0.0–0.7)
Eosinophil %: 1.6 %
HGB: 12.1 g/dL (ref 12.0–16.0)
Lymphocyte #: 1.5 10*3/uL (ref 1.0–3.6)
MCH: 31.4 pg (ref 26.0–34.0)
MCV: 92 fL (ref 80–100)
Monocyte %: 10.5 %
Neutrophil %: 62.4 %

## 2012-08-25 LAB — BASIC METABOLIC PANEL
BUN: 8 mg/dL (ref 7–18)
Chloride: 102 mmol/L (ref 98–107)
Co2: 34 mmol/L — ABNORMAL HIGH (ref 21–32)
Creatinine: 0.67 mg/dL (ref 0.60–1.30)
EGFR (Non-African Amer.): >60
Glucose: 86 mg/dL (ref 65–99)
Osmolality: 273 (ref 275–301)
Sodium: 138 mmol/L (ref 136–145)

## 2012-09-01 ENCOUNTER — Ambulatory Visit: Payer: Self-pay | Admitting: Surgery

## 2012-10-01 ENCOUNTER — Ambulatory Visit: Payer: Self-pay | Admitting: Oncology

## 2012-10-01 LAB — COMPREHENSIVE METABOLIC PANEL
Albumin: 3.6 g/dL (ref 3.4–5.0)
Bilirubin,Total: 0.3 mg/dL (ref 0.2–1.0)
Calcium, Total: 9.4 mg/dL (ref 8.5–10.1)
Chloride: 101 mmol/L (ref 98–107)
Co2: 32 mmol/L (ref 21–32)
EGFR (African American): >60
EGFR (Non-African Amer.): >60
Glucose: 112 mg/dL — ABNORMAL HIGH (ref 65–99)
Osmolality: 280 (ref 275–301)
Potassium: 3.9 mmol/L (ref 3.5–5.1)
Sodium: 141 mmol/L (ref 136–145)
Total Protein: 6.5 g/dL (ref 6.4–8.2)

## 2012-10-01 LAB — CBC CANCER CENTER
Basophil #: 0.1 x10 3/mm (ref 0.0–0.1)
Basophil %: 1.1 %
Eosinophil #: 0.1 x10 3/mm (ref 0.0–0.7)
Eosinophil %: 1.6 %
HGB: 12.4 g/dL (ref 12.0–16.0)
Lymphocyte #: 1.3 x10 3/mm (ref 1.0–3.6)
Lymphocyte %: 20.9 %
MCH: 29.4 pg (ref 26.0–34.0)
Monocyte #: 0.6 x10 3/mm (ref 0.2–0.9)
Monocyte %: 9.5 %
Neutrophil #: 4 x10 3/mm (ref 1.4–6.5)
Platelet: 181 x10 3/mm (ref 150–440)

## 2012-10-08 ENCOUNTER — Ambulatory Visit: Payer: Self-pay | Admitting: Surgery

## 2012-10-21 ENCOUNTER — Ambulatory Visit: Payer: Self-pay | Admitting: Oncology

## 2012-11-13 LAB — CBC CANCER CENTER
Basophil #: 0 x10 3/mm (ref 0.0–0.1)
Eosinophil %: 1.4 %
HCT: 37.7 % (ref 35.0–47.0)
HGB: 12.6 g/dL (ref 12.0–16.0)
Lymphocyte #: 0.8 x10 3/mm — ABNORMAL LOW (ref 1.0–3.6)
MCV: 88 fL (ref 80–100)
Neutrophil #: 3.6 x10 3/mm (ref 1.4–6.5)

## 2012-11-20 LAB — CBC CANCER CENTER
Basophil %: 1.1 %
Eosinophil %: 1.2 %
Lymphocyte #: 0.7 x10 3/mm — ABNORMAL LOW (ref 1.0–3.6)
MCH: 28.8 pg (ref 26.0–34.0)
Monocyte #: 0.5 x10 3/mm (ref 0.2–0.9)
Monocyte %: 10.7 %
Neutrophil %: 71.6 %
RBC: 4.4 10*6/uL (ref 3.80–5.20)
RDW: 14.8 % — ABNORMAL HIGH (ref 11.5–14.5)
WBC: 4.8 x10 3/mm (ref 3.6–11.0)

## 2012-11-21 ENCOUNTER — Ambulatory Visit: Payer: Self-pay | Admitting: Oncology

## 2012-11-27 LAB — CBC CANCER CENTER
Basophil %: 0.8 %
Eosinophil #: 0.1 x10 3/mm (ref 0.0–0.7)
Eosinophil %: 1.6 %
MCH: 28.8 pg (ref 26.0–34.0)
MCHC: 32.4 g/dL (ref 32.0–36.0)
MCV: 89 fL (ref 80–100)
Monocyte #: 0.6 x10 3/mm (ref 0.2–0.9)
Monocyte %: 11 %
Neutrophil #: 3.7 x10 3/mm (ref 1.4–6.5)
Neutrophil %: 71.5 %
RBC: 4.32 10*6/uL (ref 3.80–5.20)
RDW: 14.9 % — ABNORMAL HIGH (ref 11.5–14.5)
WBC: 5.2 x10 3/mm (ref 3.6–11.0)

## 2012-12-04 LAB — CBC CANCER CENTER
Basophil %: 1 %
HCT: 38.8 % (ref 35.0–47.0)
Lymphocyte #: 0.7 x10 3/mm — ABNORMAL LOW (ref 1.0–3.6)
Lymphocyte %: 14.7 %
MCHC: 32.2 g/dL (ref 32.0–36.0)
Neutrophil %: 70.4 %
RDW: 15.1 % — ABNORMAL HIGH (ref 11.5–14.5)

## 2012-12-11 LAB — CBC CANCER CENTER
Basophil #: 0 "x10 3/mm "
Basophil %: 0.8 %
Eosinophil #: 0.1 "x10 3/mm "
Eosinophil %: 1.3 %
HCT: 37.9 %
HGB: 12.4 g/dL
Lymphocyte %: 12.5 %
Lymphs Abs: 0.6 "x10 3/mm " — ABNORMAL LOW
MCH: 29.1 pg
MCHC: 32.8 g/dL
MCV: 89 fL
Monocyte #: 0.5 "x10 3/mm "
Monocyte %: 11.4 %
Neutrophil #: 3.3 "x10 3/mm "
Neutrophil %: 74 %
Platelet: 156 "x10 3/mm "
RBC: 4.27 "x10 6/mm "
RDW: 15.3 % — ABNORMAL HIGH
WBC: 4.5 "x10 3/mm "

## 2012-12-21 ENCOUNTER — Ambulatory Visit: Payer: Self-pay | Admitting: Oncology

## 2013-01-11 ENCOUNTER — Ambulatory Visit: Payer: Self-pay | Admitting: Surgery

## 2013-01-21 ENCOUNTER — Ambulatory Visit: Payer: Self-pay | Admitting: Oncology

## 2013-03-31 ENCOUNTER — Ambulatory Visit: Payer: Self-pay | Admitting: Oncology

## 2013-04-01 LAB — CANCER ANTIGEN 27.29: CA 27.29: 23.1 U/mL (ref 0.0–38.6)

## 2013-04-06 DIAGNOSIS — C50919 Malignant neoplasm of unspecified site of unspecified female breast: Secondary | ICD-10-CM | POA: Insufficient documentation

## 2013-04-06 DIAGNOSIS — E782 Mixed hyperlipidemia: Secondary | ICD-10-CM | POA: Insufficient documentation

## 2013-04-21 ENCOUNTER — Ambulatory Visit: Payer: Self-pay | Admitting: Oncology

## 2013-07-02 ENCOUNTER — Ambulatory Visit: Payer: Self-pay | Admitting: Radiation Oncology

## 2013-07-06 IMAGING — PT NM PET TUM IMG RESTAG (PS) SKULL BASE T - THIGH
1 of 5 series · 1 of 25 positions shown · non-contrast
Comparison: none

REASON FOR EXAM: Initial stage breast cancer
COMMENTS:

PROCEDURE:     PET - PET/CT RESTG BREAST CA  - December 25, 2011 [DATE]
RESULT:     Indication: Breast cancer
Radiopharmaceutical: 12.02 mCi F18-FDG, intravenously.
TECHNIQUE: Imaging was performed from the skull base to the mid-thigh using
routine PET/CT acquisition protocol.
Injection site: left antecubital
Time of FDG injection: 0379 hours
Serum glucose: 115 mg/dL
Time of imaging: 4143 hours
Comparison studies: NONE

[Series 3: ct wb 3.0 b30f · axial · 3.0mm · 0.98mm/px · 1 of 435 slices shown]
[im 435/435  brain]
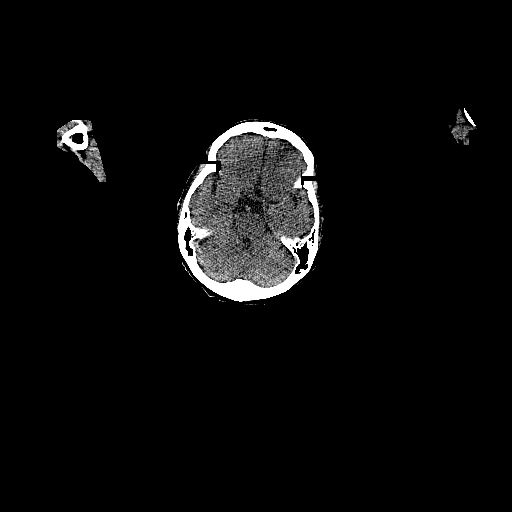

[1 of 25 positions shown; findings below may reference images not displayed]

FINDINGS: HEAD AND NECK:

There is abnormal hypermetabolic activity within a 12 mm thyroid nodule in
the isthmus of the thyroid gland with an SUV max of 4.7 and an SUV average
of 2.6 .

CHEST:

There is a moderate right pleural effusion. There is a large substernal
thyroid gland again noted measuring approximately 9.4 x 1.7 x 10 cm without
significant metabolic activity. Again noted is a spiculated 4.4 cm
hypermetabolic left breast mass with overlying skin thickening with an SUV
max of 5.3 and an SUV average of 3.2 .

The heart size is normal. There is no pericardial effusion.

There no pathologically enlarged mediastinal, hilar, or axillary lymph
nodes.

The osseous structures demonstrate no focal abnormality.

ABDOMEN/PELVIS:

The liver demonstrates no focal abnormality. There is cholelithiasis.. The
spleen demonstrates no focal abnormality. The kidneys, adrenal glands,
pancreas are normal. The bladder is unremarkable.

The unopacified bowel is unremarkable. There is no pneumoperitoneum,
pneumatosis, or portal venous gas. There is no abdominal or pelvic free
fluid. There is no lymphadenopathy.

The abdominal aorta is normal in caliber.

The osseous structures are unremarkable.
IMPRESSION: 1. Hypermetabolic left breast mass most consistent with malignancy. There
are no distant foci of hypermetabolic activity to suggest metastatic disease.

2. There is abnormal hypermetabolic activity within a 12 mm thyroid nodule
in the isthmus of the thyroid gland. A differential diagnoses includes
hyperfunctioning nodule versus thyroid cancer. Recommend further evaluation
with ultrasound.

3. Nonspecific moderate right pleural effusion.

[REDACTED]

## 2013-07-09 LAB — CANCER ANTIGEN 27.29: CA 27.29: 27.8 U/mL (ref 0.0–38.6)

## 2013-07-13 IMAGING — CR DG CHEST 1V PORT
1 series · 2 of 2 positions shown · non-contrast
Comparison: none

REASON FOR EXAM: f/u thoracoscopy, left subclavian [REDACTED]
COMMENTS:

PROCEDURE:     DXR - DXR PORTABLE CHEST SINGLE VIEW  - January 01, 2012 [DATE]
RESULT:
Comparison is made to a prior study dated 12/17/2011.

[Series 1: ap · 0.17mm/px · 2 of 2 slices shown]
[im 1/2]
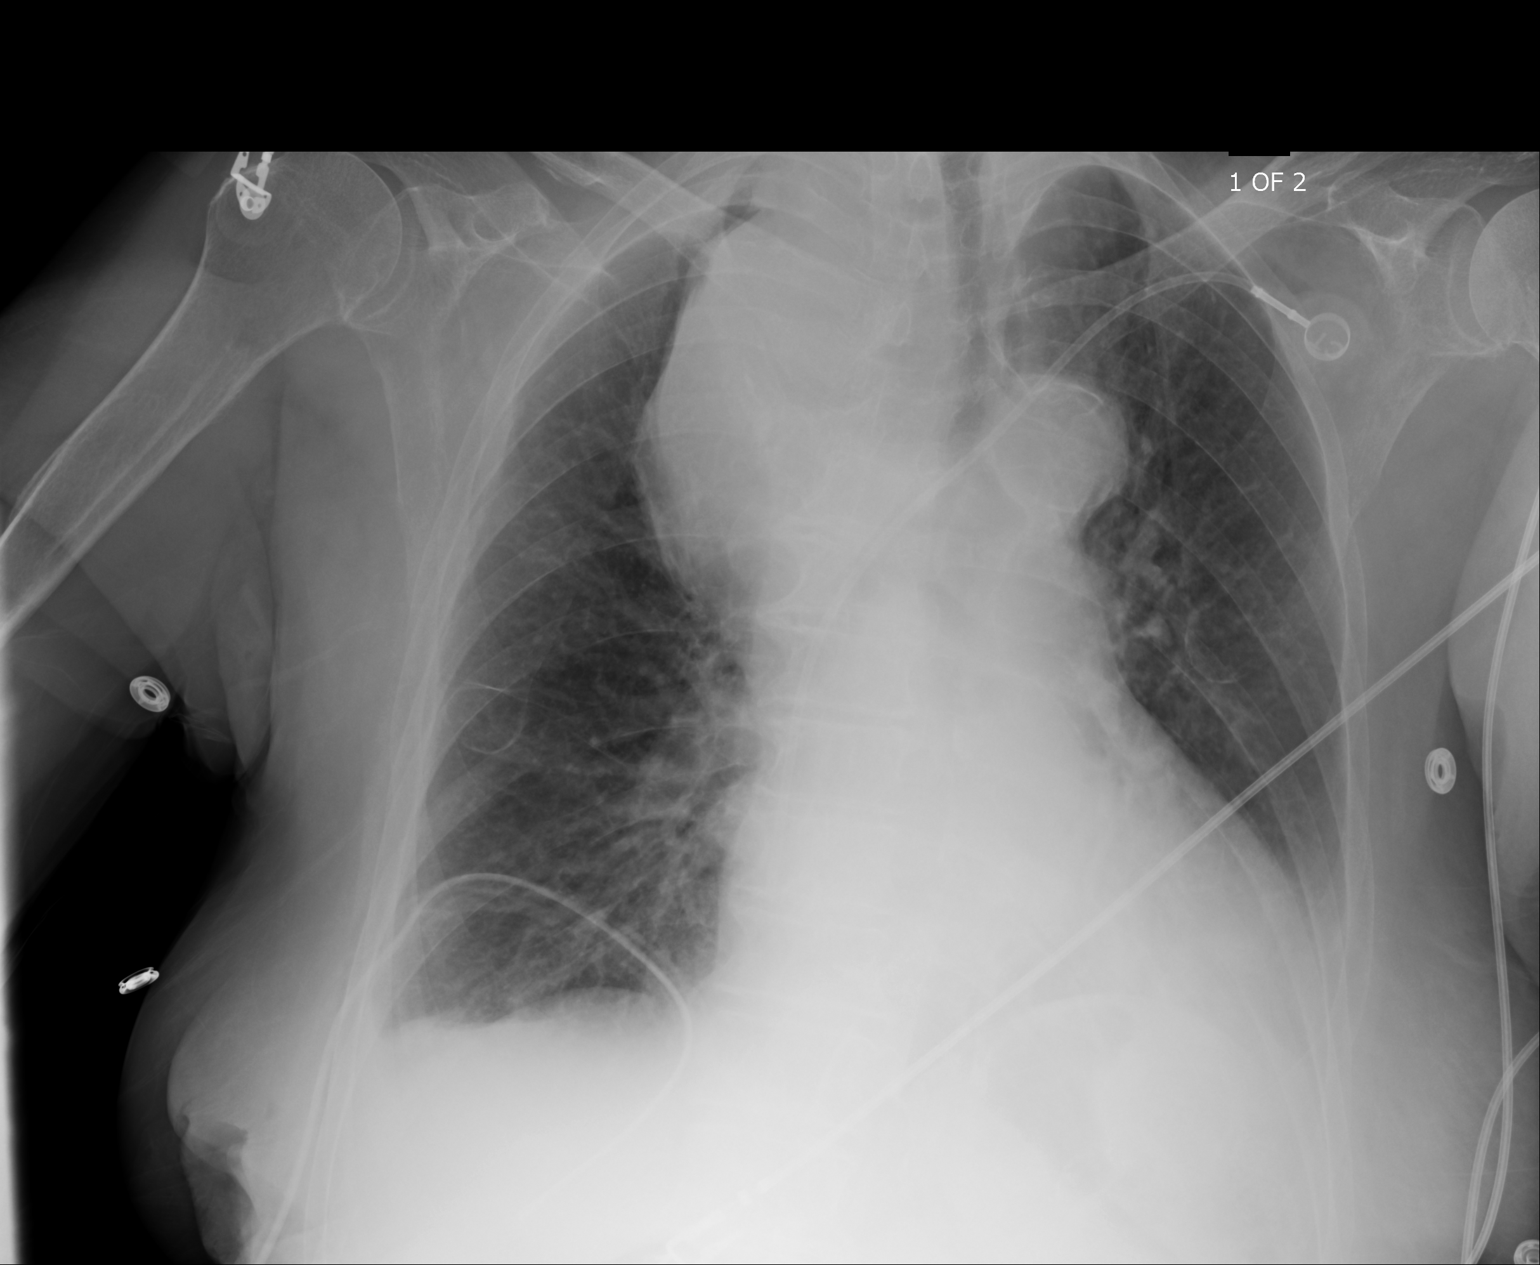
[im 2/2]
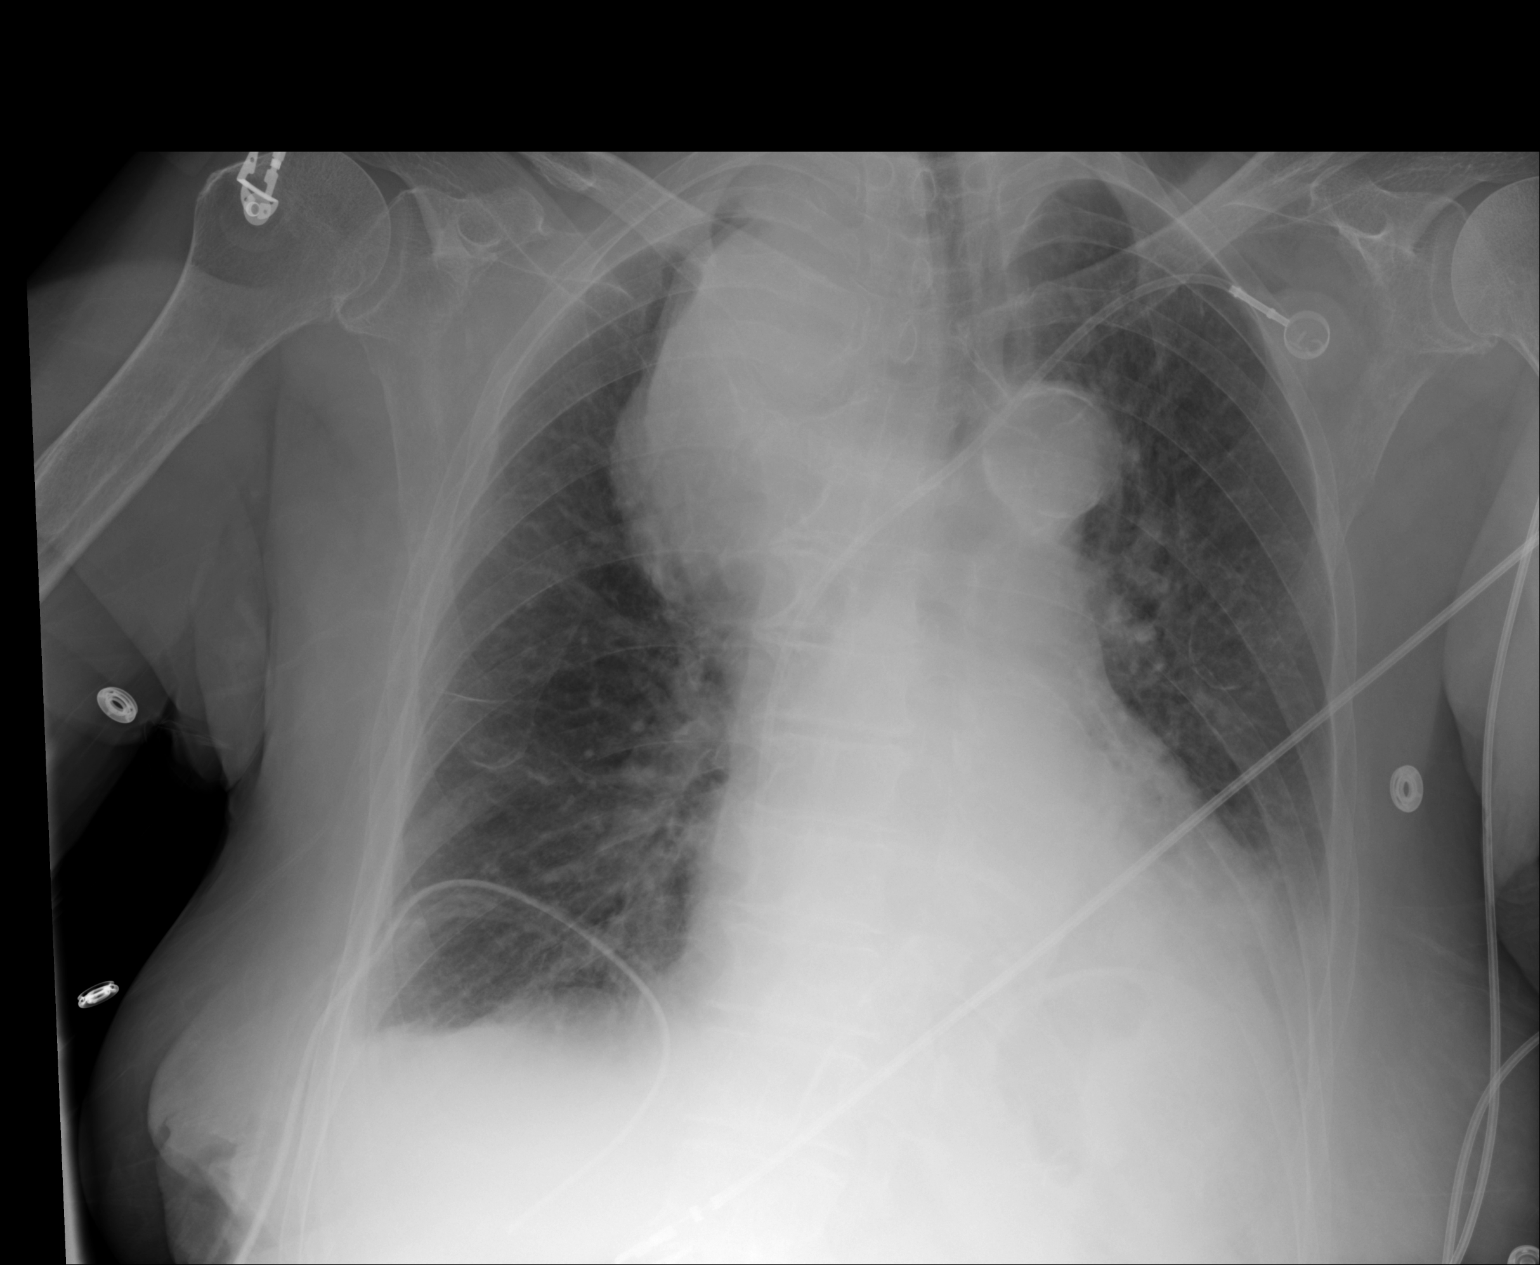

[2 of 2 positions shown; findings below may reference images not displayed]

FINDINGS: A chest tube has been placed within the base of the right
hemithorax. There does not appear to be evidence of a significant
pneumothorax on the right. There is prominence of the interstitial markings.
The patient has taken a shallow inspiration. A large, right paratracheal
mass is identified, stable when compared to the previous study. This may
represent a large thyroid goiter. There is mass effect upon the trachea with
tracheal deviation to the left. A left sided Port-A-Cath is appreciated with
the tip projecting in the region of the superior vena cava/right atrial
junction. The cardiac silhouette is moderately enlarged. The visualized bony
skeleton is unremarkable.
IMPRESSION: 1.  Chest tube within the base of the right hemithorax. There is no
appreciable evidence of pneumothorax.
2.  Paratracheal mass.
3.  Cardiac silhouette is moderately enlarged.
4.  Surveillance evaluation recommended, if and as clinically warranted.

## 2013-07-21 ENCOUNTER — Ambulatory Visit: Payer: Self-pay | Admitting: Radiation Oncology

## 2013-10-06 ENCOUNTER — Ambulatory Visit: Payer: Self-pay | Admitting: Oncology

## 2013-10-07 LAB — CANCER ANTIGEN 27.29: CA 27.29: 28 U/mL (ref 0.0–38.6)

## 2013-10-21 ENCOUNTER — Ambulatory Visit: Payer: Self-pay | Admitting: Oncology

## 2013-12-31 ENCOUNTER — Ambulatory Visit: Payer: Self-pay | Admitting: Oncology

## 2014-01-12 ENCOUNTER — Ambulatory Visit: Payer: Self-pay | Admitting: Oncology

## 2014-01-20 LAB — CANCER ANTIGEN 27.29: CA 27.29: 24.7 U/mL (ref 0.0–38.6)

## 2014-01-21 ENCOUNTER — Ambulatory Visit: Payer: Self-pay | Admitting: Oncology

## 2014-02-02 DIAGNOSIS — C50912 Malignant neoplasm of unspecified site of left female breast: Secondary | ICD-10-CM | POA: Diagnosis not present

## 2014-02-02 DIAGNOSIS — C50919 Malignant neoplasm of unspecified site of unspecified female breast: Secondary | ICD-10-CM | POA: Diagnosis not present

## 2014-02-02 DIAGNOSIS — Z170 Estrogen receptor positive status [ER+]: Secondary | ICD-10-CM | POA: Diagnosis not present

## 2014-02-21 ENCOUNTER — Ambulatory Visit: Payer: Self-pay | Admitting: Oncology

## 2014-02-22 DIAGNOSIS — I10 Essential (primary) hypertension: Secondary | ICD-10-CM | POA: Diagnosis not present

## 2014-02-22 DIAGNOSIS — I5022 Chronic systolic (congestive) heart failure: Secondary | ICD-10-CM | POA: Diagnosis not present

## 2014-02-22 DIAGNOSIS — I482 Chronic atrial fibrillation, unspecified: Secondary | ICD-10-CM | POA: Insufficient documentation

## 2014-02-22 DIAGNOSIS — E782 Mixed hyperlipidemia: Secondary | ICD-10-CM | POA: Diagnosis not present

## 2014-03-22 DIAGNOSIS — C50912 Malignant neoplasm of unspecified site of left female breast: Secondary | ICD-10-CM | POA: Diagnosis not present

## 2014-03-22 DIAGNOSIS — I482 Chronic atrial fibrillation: Secondary | ICD-10-CM | POA: Diagnosis not present

## 2014-03-22 DIAGNOSIS — E782 Mixed hyperlipidemia: Secondary | ICD-10-CM | POA: Diagnosis not present

## 2014-03-22 DIAGNOSIS — I10 Essential (primary) hypertension: Secondary | ICD-10-CM | POA: Diagnosis not present

## 2014-03-22 DIAGNOSIS — M810 Age-related osteoporosis without current pathological fracture: Secondary | ICD-10-CM | POA: Diagnosis not present

## 2014-04-12 DIAGNOSIS — E782 Mixed hyperlipidemia: Secondary | ICD-10-CM | POA: Diagnosis not present

## 2014-04-12 DIAGNOSIS — I10 Essential (primary) hypertension: Secondary | ICD-10-CM | POA: Diagnosis not present

## 2014-04-12 DIAGNOSIS — I482 Chronic atrial fibrillation: Secondary | ICD-10-CM | POA: Diagnosis not present

## 2014-05-10 NOTE — Consult Note (Signed)
PATIENT NAME:  Alexis Barry, Alexis Barry MR#:  [id number] DATE OF BIRTH:  October 15, 1934  DATE OF CONSULTATION:  12/10/2011  REFERRING PHYSICIAN:  Marlyce Huge, MD CONSULTING PHYSICIAN:  Alexis Barry. Alexis Patman, MD  REASON FOR CONSULTATION: Malignant pleural effusion and mediastinal mass.   I have personally seen and examined Ms. Alexis Barry. I have discussed her care with Alexis Barry in oncology as well as Alexis Barry in surgery.   HISTORY OF PRESENT ILLNESS: Ms. Alexis Barry is a 80 year old woman who has a long-standing history of goiter. Back in the early 70s, she had a neck incision done for removal of a goiter, but this was only a partial resection. Since that time, she has had a substernal goiter and this was imaged here at [facility] back in 2011. At that time, she was noted to have a large substernal goiter presenting mostly on the right side. The patient states that she was in her usual state of health until approximately 5 to 6 days ago when she began experiencing increasing shortness of breath. The patient noticed that she was unable to walk distances and sought medical attention by presenting to the emergency department. The chest x-ray performed in the emergency department revealed a large mediastinal mass as well as a right pleural effusion. An ultrasound-guided thoracentesis was performed removing approximately 200 mL. This has been sent for analysis. The results are still pending. The patient states that she did have some improvement after the thoracentesis.   Of note is that the patient also had a CT of the chest a year ago and just recently which showed a large left breast mass. The patient states that for the last several weeks she has noticed this and was scheduled to have a mammogram done but for a variety of reasons could not be completed. She is unsure when her last mammogram was. Alexis Barry saw the patient who thought that this may represent a breast cancer and agreed with the  thoracentesis. The CT scan findings showed the large breast mass as well as multiple pulmonary nodules in the right lung and perhaps in the pleural space as well. The patient is scheduled to undergo a breast biopsy today.   PAST MEDICAL HISTORY:  1. Hypertension. 2. Goiter. 3. History of atrial fibrillation.  4. Partial thyroidectomy in the 1970s.   DRUG ALLERGIES: No known drug allergies.   FAMILY HISTORY: She does have a brother who has liver cancer or pancreatic cancer. Her father died of heart failure and her mother died of a pulmonary embolism. There is no family history of breast cancer.   SOCIAL HISTORY: She is married. She has three children. She previously owned a [employer]. She does not smoke.   REVIEW OF SYSTEMS: As per history of present illness and all other review of systems were asked and were otherwise negative.   PHYSICAL EXAMINATION:   GENERAL: Examination revealed a pleasant, well-developed well-nourished woman in no distress. She was awake, alert, and oriented. She was in no respiratory distress. She was relaxing in the bed and able to speak in complete sentences.   HEENT: Head was normocephalic, atraumatic. The pupils were equal. The oropharynx was clear.   NECK: Supple without thyromegaly or adenopathy. She did have a well healed surgical scar. There may have been some fullness on the left side of her neck, but it was difficult to appreciate.   PULMONARY: Her lungs showed slightly diminished breath sounds at the right base.   CARDIOVASCULAR: Heart was regular. I  did not appreciate any murmurs.   ABDOMEN: Soft, nontender, and nondistended. There were no palpable masses. There was no hepatosplenomegaly.   EXTREMITIES: Her extremities revealed trace pedal edema. She had good pulses throughout.   NEUROLOGIC: Examination is grossly within normal limits.   MUSCULOSKELETAL: Examination is grossly within normal limits.  ASSESSMENT AND PLAN: I have independently  reviewed the patient's CT scan. I have discussed her care with Dr. Grayland Barry and Alexis Barry. I reviewed her x-rays with her daughter-in-law and her daughter as well as the patient.   I believe that she most likely has metastatic breast cancer with multiple lung nodules and a malignant pleural effusion. I spent a considerable period of time today reviewing with them the indications and risks of the various treatment options. I explained to them that if the pleural fluid is indeed a malignancy that one option would be to drain the pleural effusion dry and to follow this clinically after she begins her chemotherapy. I told her that 5 to 10% of the people may not have a recurrence of their pleural effusion. I also explained to them the indications and risks of Pleurx catheter insertion as well as talc pleurodesis. I explained to them the advantages and disadvantages as well as the alternatives.   At the present time, I believe all their questions were answered. We are still waiting for the final pathology. Once that is back, I will share that with Alexis Barry and Dr. Grayland Barry. If a Port-A-Cath is required that can be performed at the same time, if she elects to undergo a thoracoscopy.   Thank you very much for allowing me to participate in her care.  ____________________________ Alexis Barry. Genevive Bi, MD teo:slb D: 12/10/2011 12:41:39 ET T: 12/10/2011 12:52:08 ET JOB#: [id number]  cc: Christia Barry E. Genevive Bi, MD, <Dictator> Louis Matte MD ELECTRONICALLY SIGNED 12/22/2011 6:17

## 2014-05-10 NOTE — Consult Note (Signed)
History of Present Illness:   Reason for Consult Suspicious left breast mass.    HPI   Patient is a 80 year old female who was admitted to the hospital with uncontrolled hypertension and increasing shortness of breath.  CT scan of her chest performed in the ER noted a mediastinal mass which is reportedly unchanged from year ago and thought to be a goiter.  She also had a 3 cm breast mass concerning for malignancy.  Patient states she has known about the breast mass for several months, but did not think much of it.  Patient does not think it has changed in size, but is unclear.  She is also unclear of how long her skin changes have been there.  Currently, she feels well and much improved since admission.  She does not complain of any further shortness of breath.  She denies any chest pain.  She denies any other pain.  She has a good appetite and denies weight loss.  She has no nausea, vomiting, constipation, or diarrhea.  Patient otherwise feels well and offers no further specific complaints.  PFSH:   Additional Past Medical and Surgical History Past medical history: Hypertension, goiter, atrial fibrillation, dementia.  Past surgical history: Thyroid surgery in 1977.  Family history: CHF, hypertension.  Social history: Patient denies tco or alcohol.   Review of Systems:   Performance Status (ECOG) 0    Review of Systems   As per HPI. Otherwise, 10 point system review was negative.   NURSING NOTES: **Vital Signs.:   18-Nov-13 16:30    Vital Signs Type: Routine    Temperature Temperature (F): 97.8    Celsius: 36.5    Temperature Source: oral    Pulse Pulse: 60    Respirations Respirations: 18    Systolic BP Systolic BP: 440    Diastolic BP (mmHg) Diastolic BP (mmHg): 73    Mean BP: 91    Pulse Ox % Pulse Ox %: 96    Pulse Ox Activity Level: At rest    Oxygen Delivery: Room Air/ 21 %   Physical Exam:   Physical Exam General: Well-developed, well-nourished, no acute  distress. Eyes: Pink conjunctiva, anicteric sclera. HEENT: Normocephalic, moist mucous membranes, clear oropharnyx. Breasts: Left breast with easily palpable approximately 3 cm mass with overlying skin dimpling and discoloration. Lungs: Clear to auscultation bilaterally. Heart: Regular rate and rhythm. No rubs, murmurs, or gallops. Abdomen: Soft, nontender, nondistended. No organomegaly noted, normoactive bowel sounds. Musculoskeletal: No edema, cyanosis, or clubbing. Neuro: Alert, answering all questions appropriately. Cranial nerves grossly intact. Skin: No rashes or petechiae noted. Psych: Normal affect. Lymphatics: No cervical, calvicular, axillary or inguinal LAD.    No Known Allergies:     Aspirin Low Dose 81 mg oral tablet: 4 tab(s) orally once a day, Active, 0, None   Cardizem CD 240 mg/24 hours oral capsule, extended release: 1 cap(s) orally once a day, Active, 0, None   metoprolol tartrate 50 mg oral tablet: 1 tab(s) orally 2 times a day, Active, 0, None  Laboratory Results:  Hepatic:  18-Nov-13 06:59    Bilirubin, Total 0.5   Alkaline Phosphatase 91   SGPT (ALT) 28   SGOT (AST) 20   Total Protein, Serum  6.1   Albumin, Serum 3.4  Routine Chem:  18-Nov-13 06:59    Cholesterol, Serum 166   Triglycerides, Serum 85   HDL (INHOUSE) 52   VLDL Cholesterol Calculated 17   LDL Cholesterol Calculated 97 (Result(s) reported on 09 Dec 2011  at 07:36AM.)   Glucose, Serum  107   BUN 12   Creatinine (comp) 0.87   Sodium, Serum  132   Potassium, Serum 4.1   Chloride, Serum  95   CO2, Serum 31   Calcium (Total), Serum 8.5   Osmolality (calc) 265   eGFR (African American) >60   eGFR (Non-African American) >60 (eGFR values <45m/min/1.73 m2 may be an indication of chronic kidney disease (CKD). Calculated eGFR is useful in patients with stable renal function. The eGFR calculation will not be reliable in acutely ill patients when serum creatinine is changing rapidly. It is  not useful in  patients on dialysis. The eGFR calculation may not be applicable to patients at the low and high extremes of body sizes, pregnant women, and vegetarians.)   Anion Gap  6   Magnesium, Serum  1.7 (1.8-2.4 THERAPEUTIC RANGE: 4-7 mg/dL TOXIC: > 10 mg/dL  -----------------------)   Hemoglobin A1c ([facility]) 5.5 (The American Diabetes Association recommends that a primary goal of therapy should be <7% and that physicians should reevaluate the treatment regimen in patients with HbA1c values consistently >8%.)  Cardiac:  18-Nov-13 06:59    Troponin I < 0.02 (0.00-0.05 0.05 ng/mL or less: NEGATIVE  Repeat testing in 3-6 hrs  if clinically indicated. >0.05 ng/mL: POTENTIAL  MYOCARDIAL INJURY. Repeat  testing in 3-6 hrs if  clinically indicated. NOTE: An increase or decrease  of 30% or more on serial  testing suggests a  clinically important change)  Routine Coag:  18-Nov-13 06:59    Prothrombin 14.1   INR 1.1 (INR reference interval applies to patients on anticoagulant therapy. A single INR therapeutic range for coumarins is not optimal for all indications; however, the suggested range for most indications is 2.0 - 3.0. Exceptions to the INR Reference Range may include: Prosthetic heart valves, acute myocardial infarction, prevention of myocardial infarction, and combinations of aspirin and anticoagulant. The need for a higher or lower target INR must be assessed individually. Reference: The Pharmacology and Management of the Vitamin K  antagonists: the seventh ACCP Conference on Antithrombotic and Thrombolytic Therapy. CJKDTO.6712Sept:126 (3suppl): [id number] A HCT value >55% may artifactually increase the PT.  In one study,  the increase was an average of 25%. Reference:  "Effect on Routine and Special Coagulation Testing Values of Citrate Anticoagulant Adjustment in Patients with High HCT Values." American Journal of Clinical Pathology 2006;126:400-405.)  Routine Hem:   18-Nov-13 06:59    WBC (CBC) 6.8   RBC (CBC) 4.32   Hemoglobin (CBC) 13.6   Hematocrit (CBC) 39.2   Platelet Count (CBC) 179   MCV 91   MCH 31.4   MCHC 34.6   RDW 13.1   Neutrophil % 74.9   Lymphocyte % 12.9   Monocyte % 10.2   Eosinophil % 1.2   Basophil % 0.8   Neutrophil # 5.1   Lymphocyte #  0.9   Monocyte # 0.7   Eosinophil # 0.1   Basophil # 0.1 (Result(s) reported on 09 Dec 2011 at 07:27AM.)   Assessment and Plan:  Impression:   Suspicious left breast mass.  Plan:   1.  Breast mass: Agree with mammogram and biopsy.  Patient reports this is scheduled for tomorrow, but no orders have  placed.  Would like to get biopsy prior to patient discharge, but the results should not keep her in the hospital.  Providing this is truly a malignancy,  she would likely from neoadjuvant treatment given the size of the  lesion as well as probable skin involvement.  Will further discuss with surgery once the diagnosis is confirmed.  If patient gets discharged prior to her biopsy results, she should followup in the [place] within 1 week of her discharge. consult, will follow.  Electronic Signatures: Delight Hoh (MD)  (Signed [phone] 17:39)  Authored: HISTORY OF PRESENT ILLNESS, PFSH, ROS, NURSING NOTES, PE, ALLERGIES, HOME MEDICATIONS, LABS, ASSESSMENT AND PLAN   Last Updated: 18-Nov-13 17:39 by Delight Hoh (MD)

## 2014-05-10 NOTE — Discharge Summary (Signed)
PATIENT NAME:  Alexis Barry, Alexis Barry MR#:  [id number] DATE OF BIRTH:  10-Apr-1934  DATE OF ADMISSION:  12/08/2011 DATE OF DISCHARGE:  12/11/2011  DIAGNOSES:  1. Acute diastolic congestive heart failure in the setting of uncontrolled hypertension due to noncompliance with medication for two months. 2. Likely metastatic breast cancer with bilateral pleural effusion, possibly malignant status post right thoracentesis. 3. Left breast mass, likely breast cancer. 4. Uncontrolled hypertension. 5. Paroxysmal atrial fibrillation. 6. Large mediastinal mass, possible goiter.   DISPOSITION: The patient is being discharged home.   FOLLOW-UP: Follow-up with Dr. Genevive Bi, Dr. Grayland Ormond, Dr. Ubaldo Glassing, and PCP in 1 to 2 weeks after discharge.   DIET: Low sodium.   ACTIVITY: As tolerated.   DISCHARGE MEDICATIONS:  1. Aspirin 81 mg 4 tablets once a day. 2. Cardizem CD 240 mg once a day. 3. Lopressor 50 mg b.i.d.  4. Lasix 20 mg daily.  5. KCl 20 mEq b.i.d.   CONSULTATIONS:  1. Cardiology consultation with Dr. Nehemiah Massed and Dr. Ubaldo Glassing  2. Surgical consultation with Dr. Rexene Edison  3. Surgical consultation with Dr. Genevive Bi  4. Oncology consultation with Dr. Grayland Ormond   LABORATORY, DIAGNOSTIC, AND RADIOLOGICAL DATA: CT of the chest with contrast showed no pulmonary embolus. There was a 3.3 x 2 cm left breast mass with overlying skin thickening concerning for malignancy, bilateral pleural effusions, large right substernal thyroid mass.   Left breast ultrasound showed irregularly marginated mass.   Mammogram showed findings concerning for malignancy.   Echo showed normal LV systolic function, EF more than 55%. Right atrium is moderately dilated. Left atrium is mildly dilated. There is moderate MR, moderate TR. Right ventricular systolic blood pressure is elevated at 50 to 60.   MICROBIOLOGY: Body fluid no growth in three days. Body fluid studies, pleural fluid study, showed albumin of 1.9, nucleated cells 98, neutrophils 20,  lymphocytes 77, protein 2.4, LDH 47, glucose 123 suggestive of transudate. Urine sodium 26, osmolality 203. CBC normal. Sodium 127 on admission, 134 by the time of discharge. BNP 2681. Magnesium 1.7. Rest of electrolytes normal. VLDL 17, LDL 97. Hemoglobin A1c 5.4. Uric acid 3.4. Cholesterol 166, triglycerides 85, HDL 52. Normal TSH. Normal cardiac enzymes.   HOSPITAL COURSE: The patient is a 80 year old female with history of goiter and hypertension who presented with shortness of breath and chest pressure. She was found to have acute diastolic CHF in the setting of uncontrolled hypertension. She had not taken any medications for the last two months. Echo was done which showed EF of 55% with moderate MR, dilated left atrium, and elevated right ventricular systolic pressure. The patient also was found to have atrial fibrillation which was new in onset. A cardiology consultation with Dr. Ubaldo Glassing was obtained. The patient was started on beta-blocker and Cardizem with good control of her heart rate and blood pressure. She was also diuresed with Lasix with good symptom control. The patient was found to have bilateral pleural effusion, right more than left, and underwent thoracentesis on 12/09/2011. The fluid showed no growth and appeared transudative. Cytology has been sent out but is pending. The patient was found to have a left breast mass which had been present for several months. Further work-up revealed findings concerning for malignancy. The patient has undergone biopsy 12/10/2011 and has been seen by Dr. Grayland Ormond. Dr. Genevive Bi also evaluated the patient during the hospitalization and felt that patient had malignant pleural effusion. The patient will follow-up with Dr. Grayland Ormond and Dr. Genevive Bi within one week. By that time her  biopsy and cytology results should be back. She was also found to have a large mediastinal mass, possibly goiter. She has a history of goiter in the past and had partial thyroidectomy for it in  1977. Her TSH was normal. The patient was offered home health, however, she refused. She is being discharged home in a stable condition. She will follow-up with her PCP, Dr. Grayland Ormond, Dr. Genevive Bi, and Dr. Ubaldo Glassing in 1 to 2 weeks after discharge.   TIME SPENT: 45 minutes.   ____________________________ Cherre Huger, MD sp:drc D: 12/11/2011 15:45:39 ET T: 12/12/2011 11:40:26 ET JOB#: [id number]  cc: Cherre Huger, MD, <Dictator> Cherre Huger MD ELECTRONICALLY SIGNED 12/12/2011 15:16

## 2014-05-10 NOTE — Discharge Summary (Signed)
PATIENT NAME:  Alexis Barry, Alexis Barry MR#:  [id number] DATE OF BIRTH:  06/23/1934  DATE OF ADMISSION:  01/01/2012 DATE OF DISCHARGE:  01/05/2012   ADMITTING DIAGNOSIS: Breast mass with right pleural effusion.   DISCHARGE DIAGNOSIS: Breast mass with right pleural effusion.   OPERATION PERFORMED: Insertion of Port-A-Cath and right thoracoscopy with talc pleurodesis.   HOSPITAL COURSE: The patient is a 80 year old woman who was recently diagnosed with a left breast carcinoma. At the same time, she was diagnosed with a large right-sided pleural effusion. Because of the risk of a malignant pleural effusion, she was offered a diagnostic and therapeutic thoracoscopy and pleurodesis. The patient was brought into the hospital on December 11, where under general anesthesia she underwent insertion of a Port-A-Cath and a right thoracoscopy, pleural biopsy and talc pleurodesis. Fortunately, the pleural effusion and pleural biopsies were negative for malignancy. She did well postoperatively and her chest tube was removed on December 15, and she was subsequently discharged to home. She was scheduled for followup with Dr. Genevive Bi.   DISCHARGE MEDICATIONS: Included letrozole, potassium, furosemide, aspirin, Cardizem and metoprolol. She was also given a prescription for Norco for pain.   DISCHARGE INSTRUCTIONS: She was given a regular diet and was told to remove her Steri-Strips in 7 to 10 days, but to keep the dressing in place for the next 48 hours.    ____________________________ Lew Dawes Genevive Bi, MD teo:jm D: 01/07/2012 07:14:32 ET T: 01/07/2012 19:31:04 ET JOB#: [id number]  cc: Christia Reading E. Genevive Bi, MD, <Dictator> Louis Matte MD ELECTRONICALLY SIGNED 01/12/2012 5:03

## 2014-05-10 NOTE — Consult Note (Signed)
Brief Consult Note: Diagnosis: Probable Malignant Pleural Effusion.   Patient was seen by consultant.   Consult note dictated.   Discussed with Attending MD.   Comments: Await final path before determining treatment.  Discussed with Drs. Lundquist and Finnegan.  Electronic Signatures: Louis Matte (MD)  (Signed [phone] 12:41)  Authored: Brief Consult Note   Last Updated: 19-Nov-13 12:41 by Louis Matte (MD)

## 2014-05-10 NOTE — Consult Note (Signed)
PATIENT NAME:  Alexis Barry, Alexis Barry MR#:  [id number] DATE OF BIRTH:  1934-05-14  DATE OF CONSULTATION:  12/09/2011  REFERRING PHYSICIAN:   CONSULTING PHYSICIAN:  Harrell Gave A. Meda Dudzinski, MD  REASON FOR CONSULTATION: Left breast mass and large substernal right paratracheal mass.   HISTORY OF PRESENT ILLNESS: Alexis Barry is a pleasant 80 year old female who was recently admitted into [facility] for shortness of breath. She was shown on CT scan to have a large right-sided effusion for which she had thoracentesis, done in the emergency room, with improvement. She had a CT of the chest at that time which showed an approximately 3 x 2 cm breast mass, which she noted 5 to 6 months ago, but said it relatively has not changed. She can give me very little regarding whether or not the skin changes are new or if she just noticed them months ago. She does report that she has never had problems with shortness of breath before. She did have thyroid surgery years ago. In addition, she says that she has not had any nipple discharge. She says that her menses began at age 76. She has three children the oldest of which was at age 58. She has never been on any oral contraceptives or hormone replacements. Her last mammogram was over 20 years ago. She does not have any family history of breast cancer. Regarding her infiltrate on the right side, for which she had paracentesis, she says that she does feel better. Otherwise no fevers, chills, night sweats, or shortness of breath currently, or chest pain, nausea, vomiting, diarrhea, or constipation.  PAST MEDICAL HISTORY:  1. Hypertension. 2. History of thyroid surgery for goiter approximately 20 years ago.  3. History of atrial fibrillation, particularly on admission.   ALLERGIES: No known drug allergies.   PAST SURGICAL HISTORY: Goiter surgery in 1977.   FAMILY HISTORY: Denies any history of cancer. Father has congestive heart failure. No history of  coronary artery disease. Father had hypertension as well.  CURRENT MEDICATIONS:  1. Metoprolol 50 mg p.o. twice a day. 2. Cardizem 240 mg p.o. daily.  3. Aspirin 81 mg p.o. daily.   SOCIAL HISTORY: Denies current tobacco and alcohol, married, is in a skilled nursing facility for dementia and Parkinson's, and history of stroke.   REVIEW OF SYSTEMS: A total 12 point review of systems was obtained. Pertinent positives and negatives as above.   PHYSICAL EXAMINATION:  VITALS: Temperature 98.1, pulse 70, blood pressure 128/79, respirations 18, and pulse oximetry 93% on room air.   GENERAL: No acute distress, alert and oriented x3.   HEAD: Normocephalic, atraumatic.   EYES: No scleral icterus. No conjunctivitis.   NECK: No obvious swelling. No lymphadenopathy  CHEST: Lungs clear to auscultation. Moving air well.   LYMPH: No supraclavicular or neck lymphadenopathy. No bilateral axillary lymphadenopathy.  BREAST: Right breast may have a small nodule inferior to the right areola, at approximately 6 to 7 o'clock. Left breast has skin changes and large mass with dimpling at approximately 5 o'clock  position to me, approximately 4 x 3 cm.   RESULTS: Her white cell count is normal. INR and platelets are normal. Creatinine is normal.   CT scan shows large thoracic mass which appears similar to previous CTs, as well as a mass in the left breast which was seen on a previous CT.   ASSESSMENT AND PLAN: Ms. Alexis Barry is an unfortunate 80 year old female with a left breast mass with overlying skin changes. We will follow up  on mammograms to ensure there are not other lesions in her breast. We will also follow-up on ultrasound which has already been ordered. We will likely need a biopsy, which I will likely perform tomorrow. Due to overlying skin changes, we will obtain skin dentures to rule out inflammatory breast cancer as well as Tru-Cut biopsies; however, if there are multiple areas of concern on  mammogram, we may defer to radiology.  I will defer to Dr. Genevive Bi for evaluation of sternal mass. 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 We will also recommend oncology input regarding treatment for this large breast mass, although it is  unlikely that it could be taken out with partial mastectomy and would likely require full mastectomy. However, we will leave it up to oncologist regarding potential downsizing prior to surgery. ____________________________ Glena Norfolk Debrina Kizer, MD cal:slb D: 12/09/2011 16:57:21 ET T: 12/10/2011 08:09:10 ET JOB#: [id number]  cc: Harrell Gave A. Kyeshia Zinn, MD, <Dictator> Floyde Parkins MD ELECTRONICALLY SIGNED 12/10/2011 17:56

## 2014-05-10 NOTE — Op Note (Signed)
PATIENT NAME:  Alexis Barry, Alexis Barry MR#:  [id number] DATE OF BIRTH:  [birth year]  DATE OF PROCEDURE:  12/10/2011  ATTENDING PHYSICIAN: Harrell Gave A. Vonne Mcdanel, MD  PREOPERATIVE DIAGNOSIS: Left breast mass with skin ulceration.   POSTOPERATIVE DIAGNOSIS: Left breast mass with skin ulceration.   PROCEDURES PERFORMED: Tru-Cut core biopsy of left breast mass and punch biopsy of ulcerated skin overlying breast mass.   ESTIMATED BLOOD LOSS: 5 mL.   ANESTHESIA: MAC.   SPECIMENS: Punch biopsy and multiple Tru-Cut core specimens.   INDICATION FOR SURGERY: Alexis Barry is an unfortunate 80 year old female with a large left breast mass which is likely cancer. I brought her to the Operating Room for biopsy of mass and overlying skin.   DETAILS OF PROCEDURE: Alexis Barry is brought in the Operating Room suite. She was sedated. Her left breast was then prepped and draped in standard surgical fashion. A time out was then performed correctly identifying patient name, operative site, and procedure to be performed. At the left most aspect of her skin changes I used a 3.5 mm skin biopsy and made a core in the skin overlying that area. Through the skin biopsy site I then obtained multiple Tru-Cut cores using a 16-gauge Tru-Cut biopsy device. Multiple cores were taken. Then pressure was held on the breast. There was no obvious bleeding. A single 4-0 Vicryl nylon was used to close the skin. A sterile pressure dressing was then placed over the wound. Patient was then awoken and taken to postanesthesia care unit. There were no immediate complications. Needle, sponge, and instrument count was correct at the end of procedure.  ____________________________ Glena Norfolk. Khady Vandenberg, MD cal:cms D: 12/10/2011 15:07:45 ET T: 12/10/2011 15:30:00 ET  JOB#: [id number] cc: Harrell Gave A. Olubunmi Rothenberger, MD, <Dictator> Floyde Parkins MD ELECTRONICALLY SIGNED 12/11/2011 15:53

## 2014-05-10 NOTE — Op Note (Signed)
PATIENT NAME:  Alexis Barry, Alexis Barry MR#:  [id number] DATE OF BIRTH:  05-May-1934  DATE OF PROCEDURE:  01/01/2012  ATTENDING PHYSICIAN:  Harrell Gave A. Fawaz Borquez, MD  PREOPERATIVE DIAGNOSIS: Left breast cancer, need for Port-A-Cath.   POSTOPERATIVE DIAGNOSIS: Left breast cancer, need for Port-A-Cath.   PROCEDURE PERFORMED: Attempted right subclavian and right internal jugular Port-A-Cath placement, successful left subclavian Port-A-Cath placement.   ANESTHESIA: General.  ESTIMATED BLOOD LOSS: 30 mL.   SPECIMENS: None.   INDICATION FOR SURGERY: [name] is a pleasant 80 year old female with history of breast cancer in her left breast. She is to undergo neoadjuvant chemotherapy. She is in need of a Port-A-Cath.   DETAILS OF PROCEDURE: After doing a diagnostic thoracoscopy and pleurodesis with Dr. Genevive Bi we decided to place a Port-A- Catheter. Informed consent was obtained. Her right neck and chest were prepped in standard surgical fashion. A timeout was then performed correctly identifying patient name, operative site, and procedure to be performed. The right subclavian vein was cannulated. The wire was advanced and under fluoro appeared to be going into [name] neck. This happened repeatedly and therefore I attempted right internal jugular central venous catheter placement. I then under ultrasound visualization cannulated the right jugular internal jugular vein, there was good flow and then I proceeded to place the wire through that and again the wire would only go about 20 cm repeatedly before being unable to be advanced. At this time I spoke with Dr. Leanora Cover who attempted subclavian placement and had the same results with it ascending into the neck. I then unfortunately opted for the left subclavian vein. The left subclavian vein was accessed with a single stick without difficulty. The wire was advanced under fluoroscopy. It went to the correct position. The needle was then withdrawn. A pocket was  formed. The port was secured to the pocket using interrupted 3-0 Prolene. The dilator and introduction sheath was placed over the wire under direct visualization and went to the correct place. The wire was then removed. The catheter was then placed through the introducer sheath. The wire was then attached to the Port-A-Cath. There was good flush and return. The pocket was then washed. The catheter was examined with fluoro and found to be in correct position. The incision was then closed with a 3-0 Vicryl deep dermal and a 4-0 Monocryl running. Dermabond was then placed over the wound. The patient was then awoken, extubated and brought to the postanesthesia care unit. Postoperative chest x-ray is pending. There were no immediate complications.   ____________________________ Glena Norfolk Jordin Vicencio, MD cal:cms D: 01/01/2012 12:09:42 ET T: 01/01/2012 12:26:09 ET JOB#: [id number]  cc: Harrell Gave A. Joran Kallal, MD, <Dictator>  Floyde Parkins MD ELECTRONICALLY SIGNED 01/03/2012 10:37

## 2014-05-10 NOTE — H&P (Signed)
PATIENT NAME:  Alexis Barry, Alexis Barry MR#:  [id number] DATE OF BIRTH:  Jul 04, 1934  DATE OF ADMISSION:  12/08/2011  PRIMARY CARDIOLOGIST: Serafina Royals, MD  REFERRING PHYSICIAN: Ferman Hamming, MD  REASON FOR ADMISSION: Acute shortness of breath.   HISTORY OF PRESENT ILLNESS: Alexis Barry is a very nice 80 year old female who has history of hypertension. The patient has been off of her medications for over two months and is starting to feel a little bit weak and complaining of exertional dyspnea for which she went back to consult her primary care physician who noticed that in the office her blood pressure was in the 180s to 190s over 120 for what they restarted her home medications, which were lisinopril and metoprolol. He also noticed that he had a finding on her EKG of atrial fibrillation. She was rate controlled for what the patient was referred to Dr. Nehemiah Massed. Dr. Nehemiah Massed has ordered a stress test to evaluate the patient's symptoms for the past Friday. Unfortunately, the patient was feeling really weak and tired and did not think that she would be able to do the stress test for what she did not show up to her appointment. She has stayed at home and for the past couple of days noticed an increase in her shortness of breath and for the past maybe two weeks it has been to the point that she just walks maybe a distance of 20 feet and started having a little bit of sensations of being winded, but for the past couple of days she goes a couple of steps and gets really short of breath and has to stop and take a little break. She states that today she was doing okay and was not that short of breath in the morning, but all of a sudden started getting really short of breath, gets really anxious, and could not really inhale very well, "take a breath", and also started having some pressure on her chest, mostly located in both lungs, more on the right than the left. The patient denies any sharp pains on her chest, no  radiation of any pain. She says that the sensation was tightness, soreness type of pain, a little bit of pressure, and it was in intensity of 2 to 3 out of 10. There was no diaphoresis. There was no nausea or vomiting associated to any of this. The patient is brought to the ER where she is evaluated. Her initial oxygen saturation was around 95% on room air, respirations were around 18, and her pulse was 63. Once the ER physician evaluated the patient, thought that this could be a CHF exacerbation, the patient had Lasix given, aspirin given, nitroglycerin given, and she is starting to feel a little bit better after Lasix. Chest x-ray was done showing a large mass at the level of the mediastinum which apparently has been seen about a year ago that the patient has been told is a thyroid mass. In the past, she had a goiter removed so she thought there might be a residual of this and decided not to consult any doctors or followup about this finding. Since the finding, we ordered a CT scan of the chest that showed a mediastinal mass. The description of the mass is large right substernal thyroid. As an incidental finding, it shows a 3.3 x 2 cm left breast mass overlying the skin thickness, recommend further evaluation with mammography and it is very concerning for malignancy. The patient states that this mass has been going on for almost  over a year, but she did not want to give any importance to it. The patient is admitted for further evaluation.  REVIEW OF SYSTEMS: CONSTITUTIONAL: Denies any fever or fatigue prior to the last couple of weeks, but now she feels really weak. She denies any significant pain, other than the chest tightness. EYES: Denies any blurry vision or cataracts. ENT: Denies any tinnitus, difficulty swallowing, postnasal drip, or discharge. RESPIRATORY: Positive shortness of breath. Positive exertional dyspnea. Negative edema. Negative syncope. Negative cough or wheezing. CARDIOVASCULAR: Chest  tightness but no significant chest pain in the past, no orthopnea, no syncopal episodes. GI: No nausea, vomiting, or change in her stool. ENDOCRINE: No polyuria, polydipsia, or polyphagia. GYN: Positive breast mass. No vaginal bleeding. GU: Negative dysuria or hematuria. SKIN: Negative for acne or rashes. There is an ulceration of the skin of the breast and the mass is deforming the breast pulling some tissue inside the breast. MUSCULOSKELETAL: No back pain. No gout. No other joint pain. NEURO: No numbness or tingling. No CVA or transient ischemic attack. PSYCHIATRIC: No insomnia. No depression.   PAST MEDICAL HISTORY:  1. Hypertension.  2. Breast mass. 3. Thyroid goiter.  4. History of atrial fibrillation. One time she was hospitalized and was told that it was resolved, but now she is in atrial fibrillation again.   ALLERGIES: No known drug allergies.   PAST SURGICAL HISTORY: Goiter surgery in 1977.  FAMILY HISTORY: Denies any myocardial infarction. Her dad has congestive heart failure. Denies cancer. Her dad had hypertension as well.   CURRENT MEDICATIONS:  1. Metoprolol 50 mg twice daily.  2. Cardizem 240 mg once daily.  3. Aspirin 81 mg daily.   SOCIAL HISTORY: Negative for tobacco. Negative for alcohol. She is married. Her husband has recently been admitted to a skilled nursing facility due to dementia and Parkinson. He also had a CVA.   PHYSICAL EXAMINATION:   VITAL SIGNS: Blood pressure is 167/78, pulse 68, respiratory rate 20, and temperature 97.9.   GENERAL: The patient is alert and oriented x3, in no acute distress. No respiratory distress. Relaxing in bed, now she says she is feeling better.   HEENT: Her pupils are equal and reactive. Extraocular movements intact. Mucosa moist. No oral lesions.   NECK: Supple. No JVD, no thyromegaly, and no adenopathy.   CARDIOVASCULAR: Irregular, irregular. No murmurs, rubs, or gallops. No dullness to percussion. No pain with palpation of  the chest.   PULMONARY: Lungs have significant decrease of right pulmonary sounds in the base, dullness to percussion is positive. No use of accessory muscles. On the left side, there are mild crackles in mid lobe and lower lobe.  ABDOMEN: Soft, nontender, and nondistended. No hepatosplenomegaly. No masses. Bowel sounds are positive.   BREASTS: She has a significantly large mass about 3 to 4 cm on the left breast which is located at 5 o'clock which is showing some ulceration of the skin and is showing also some deformity of the breast with the mass pulling the skin. There is no lymphadenopathy in axilla, supraclavicular, neck, or epitrochlear areas.   ABDOMEN: Soft, nontender, and nondistended. No hepatosplenomegaly.  GENITAL: Deferred.   EXTREMITIES: No edema, no cyanosis, and no clubbing.   MUSCULOSKELETAL: No significant deformity of back or joints.   PSYCH: Mood is very flat affect and the patient states she does not have a cancer, she does not have a cancer, and she does not have cancer; she said it three times.   NEUROLOGIC: Cranial  nerves II through XII grossly intact. No focal abnormalities.   SKIN: No other significant rashes or petechiae.   RESULTS: As mentioned above, CT scan and chest x-ray.  Blood sugar 127. BNP 2600. Sodium 127, chloride 93, anion gap 6.   Troponins are negative. Thyroid stimulating hormone 2.1. White count is 9.3, hemoglobin 14, and platelets 223.   ASSESSMENT AND PLAN: A 80 year old female with acute dyspnea and chest pressure. She has history of thyroid goiter. Now she has mass on her mediastinum which is an intrathoracic thyroid goiter. She has hypertension, she has a new breast mass, and she has atrial fibrillation.  1. Chest pain. The patient has mostly chest tightness which is likely due to her shortness of breath and congestive heart failure. I think it will be important to rule out any acute cardiac syndrome, although her troponins are negative and  her EKG does not show any specific changes. We are going to do troponins, serially, and ask for cardiology consultation. I am not going to order a stress test at this moment because there are some other things going on, as mentioned above. This mass on her chest will need to be evaluated and we will defer to Dr. Nehemiah Massed if he wants to do the stress test in the morning.  2. Shortness of breath, multifactorial. Mass on the mediastinum is pushing the trachea to the side.  3. She has a large right pleural effusion.  4. She has slight volume overload with maybe mild congestive heart failure exacerbation. We are going to get an echocardiogram to evaluate for congestive heart failure. She had Lasix which has helped actually her shortness of breath and we are going to do a thoracentesis right now to rule out the possibility of malignant effusion. We are also going to take some fluid out to increase her capacity for breathing.  5. Paroxysmal atrial fibrillation. At this moment, she is rate controlled. We will continue Cardizem.  6. Hypertension. Her blood pressure is controlled. We are going to continue metoprolol and Cardizem and follow-up results. We are going to add p.r.n. hydralazine.  7. Other medical problems include the breast mass for what she is going to have a mammogram and surgical consultation.  8. Deep vein thrombosis prophylaxis. We are going to use low dose heparin. I am not going to fully anticoagulate the patient because it is very likely she is going to have surgery of this breast for biopsy. We are going to ask Dr. Nehemiah Massed to also do some clearance for the surgery as well.  9. Gastrointestinal prophylaxis. PPI.  CODE STATUS: FULL CODE.   TIME SPENT: I spent so far 50 minutes with this admission.  ____________________________ Keene Sink, MD rsg:slb D: 12/08/2011 19:02:39 ET T: 12/09/2011 07:37:53 ET JOB#: [id number]  cc: Cameron Sink, MD, <Dictator> Dalexa Gentz  America Brown MD ELECTRONICALLY SIGNED 12/16/2011 13:14

## 2014-05-10 NOTE — Op Note (Signed)
PATIENT NAME:  Alexis Barry, DISHON MR#:  [id number] DATE OF BIRTH:  04/02/1934  DATE OF PROCEDURE:  01/01/2012  PREOPERATIVE DIAGNOSIS: Recurrent right-sided pleural effusion.   POSTOPERATIVE DIAGNOSIS: Recurrent right-sided pleural effusion.   PROCEDURES PERFORMED:  1. Preoperative bronchoscopy to assess endobronchial anatomy.  2. Right thoracoscopy with pleural biopsy and talc pleurodesis.   SURGEON: Dewaine Morocho E. Genevive Bi, MD  ASSISTANTS: Marlyce Huge, MD / Donita Brooks, PA Student  INDICATION FOR PROCEDURE: [name] is a 80 year old woman with a recent diagnosis of breast cancer. She was found to have a large right-sided pleural effusion with possible studding of the pleural space on a CT scan. In order to adequately stage the patient and to manage her pleural effusion, she was offered the above-named procedure.   DESCRIPTION OF PROCEDURE: The patient was brought to the operating suite and placed in the supine position. General endotracheal anesthesia was given through a double-lumen tube. Preoperative bronchoscopy was carried out. There was no evidence of any endobronchial tumor. The tube was positioned to be in the proximal left mainstem. The patient was then turned for right thoracoscopy. All pressure points were carefully padded. The patient was prepped and draped in the usual sterile fashion. A single port was created in the seventh intercostal space. The incision was deepened down through the muscles of the chest wall until the pleural space was entered. Once the pleural space was entered, 1.3 liters of clear yellow fluid was removed from the pleural space. A thoracoscope was then introduced and thoracoscopy was carried out. We could see the large substernal mass. There were no abnormalities identified on the surface of the lung. There were a couple of pleural plaques posteriorly along the inferior aspects of the ribs. These measured only a few millimeters in width and length. The  diaphragm was examined. There was no obvious evidence of metastatic disease. After adequate visualization of the hemithorax was carried out, 5 grams of sterile talc was then insufflated under direct visualization coating the entire pleural space. The chest was then drained with a 28 Blake tube positioned along the paravertebral space. This was brought out through a separate stab wound. The wounds were then closed in multiple layers of running absorbable suture. The tube was secured with silk. Sterile dressings were applied. At this point, the patient was then turned and a Port-A-Cath was to be inserted by Dr. Rexene Edison. He will dictate this under separate cover.  ____________________________ Lew Dawes Genevive Bi, MD teo:slb D: 01/01/2012 11:26:29 ET T: 01/01/2012 11:32:16 ET JOB#: [id number]  cc: Christia Reading E. Genevive Bi, MD, <Dictator> Kathlene November. Grayland Ormond, Rodriguez Hevia MD ELECTRONICALLY SIGNED 01/07/2012 6:43

## 2014-05-10 NOTE — H&P (Signed)
PATIENT NAME:  Alexis Barry, Alexis Barry MR#:  [id number] DATE OF BIRTH:  06/18/1934  DATE OF ADMISSION:  12/08/2011  ADDENDUM:   The patient has a large pleural effusion and status thoracentesis right now. I obtained about 200 mL of yellow colored fluid without complications. The procedure was done after obtaining consent from the patient. There were not any immediate complications. Please see procedure notes.  PROBLEM LIST: Hyponatremia: In her setting it is likely due to fluid overload. Cannot rule out any other problems like malignancy, especially lung malignancy having this large pleural effusion. We are going to obtain a urine sodium, urine osmolality, and uric acid to rule out SIADH.   Mildly elevated glucose: The patient does not have any history of hyperglycemia although her blood sugar is mildly elevated. We are going to order a hemoglobin A1c and try to evaluate her risk stratification for cardiovascular disease this way. We are going to also check her cholesterol for the same reason.  She is not currently taking a statin as an outpatient.    ____________________________ [place] Sink, MD rsg:bjt D: 12/08/2011 20:03:37 ET T: 12/09/2011 08:14:21 ET JOB#: [id number]  cc: [place] Sink, MD, <Dictator> Sigurd Pugh America Brown MD ELECTRONICALLY SIGNED 12/16/2011 13:16

## 2014-05-10 NOTE — Consult Note (Signed)
General Aspect 80 yo female with history of hypertension and atrial fibrillation who was admitted after noting increasing shortness of breath and rapid heaert rate copmplaints as outpatient. She was noted to have pulse ox of 95% on arrival and a heart rate of 65. She had been off all of her meds for several months prior to presenting to her pcp recently who noted her to be hypertensive and in atrial fibrillation. She was placed back on metoprolol and ace inhibitor and sent to Dr. Nehemiah Massed for considreation of further work up Redan was maintained on her drugs and set up for a functional study which she was not complinat with due to wekaness and fatigue. In the er , cxr revealed a large mediastinal mass and a large right pleural effusion. She was admitted with underwent a throacentesis of the right effusion. the mass was suspiciious for malignancy. She also was noted to have a breast mass which she states had been present for some time. She is currently in afib with controlled vr and denies any symptpoms at present. She is much less short of breath after the thoracentisis. EKG revelas afib with controlled vr. She has ruled out for an mi. Echo was somewhat diffucult to evaluate secondary to poor soundwave transmission but appeard to reveal preserved lv funciton.   Physical Exam:   GEN well developed, no acute distress    HEENT PERRL    RESP rhonchi  decreased breath sounds on the right.    CARD Irregular rate and rhythm  Murmur    Murmur Systolic    Systolic Murmur axilla    ABD denies tenderness  normal BS    LYMPH negative neck    EXTR negative cyanosis/clubbing, positive edema    NEURO cranial nerves intact, motor/sensory function intact    PSYCH poor insight   Review of Systems:   Subjective/Chief Complaint shortness of breath    General: Weight loss or gain  Fatigue  Weakness    Skin: Color changes    ENT: No Complaints    Eyes: No Complaints    Neck: No Complaints     Respiratory: Short of breath    Cardiovascular: Palpitations  Dyspnea    Gastrointestinal: No Complaints    Genitourinary: No Complaints    Vascular: No Complaints    Musculoskeletal: No Complaints    Neurologic: No Complaints    Hematologic: No Complaints    Endocrine: No Complaints    Psychiatric: No Complaints    Review of Systems: All other systems were reviewed and found to be negative    Medications/Allergies Reviewed Medications/Allergies reviewed   Home Medications: Medication Instructions Status  Aspirin Low Dose 81 mg oral tablet 4 tab(s) orally once a day Active  Cardizem CD 240 mg/24 hours oral capsule, extended release 1 cap(s) orally once a day Active  metoprolol tartrate 50 mg oral tablet 1 tab(s) orally 2 times a day Active   EKG:   Interpretation atrial fibrillation with variable ventricular response    No Known Allergies:     Impression 80 yo female with history of hypertension and afib who was admitted ith complaints of shortness of breath and rapid heeart rate. She had been off of her meds for some time and had only recently been restarted on her beta blockers and ace i. She was noted to have a a breast mass and a large mediastinal mass with large right pleural effusion on admission. She underwent a thoracentesis of the effusion with  path pending but had suspicious cells for malignancy. Her afib is rate controlled at present. She is being evaluated by oncology for possible malignancy. She was scheduled for a funcitonal study as an outpatient prior to admission. Would defer this at present as she is post thoracentesis. Should surgical intervention of her breast mass or mediastnal mass, consideration for funcitonal sutdy to guide risk statification could be consideraed at that time. She appears rate controlled form her afib at present and stable. Would not start chronic anticoagulation other than asa at present given possible pending surigcal biopsy or  intervention.    Plan 1. Continue rate control with beta blockers and calcium channel blockers as you are doing 2. COntorl hypertension with afterload reduction and beta blockers/calcium channel blockers 3. Conitnue to workup effusion and breast and mediastinal mass 4. Consider funcitonal study as outpatient  if srugical procedure is contemplated to risk stratify. 5. Defer chronic anticoagulaton at present until surgical plans are delineated. would continue with asa at present.   Electronic Signatures: Teodoro Spray (MD)  (Signed [id number] 22:06)  Authored: General Aspect/Present Illness, History and Physical Exam, Review of System, Home Medications, EKG , Allergies, Impression/Plan   Last Updated: 18-Nov-13 22:06 by Teodoro Spray (MD)

## 2014-05-11 DIAGNOSIS — R7301 Impaired fasting glucose: Secondary | ICD-10-CM | POA: Diagnosis not present

## 2014-05-11 DIAGNOSIS — I10 Essential (primary) hypertension: Secondary | ICD-10-CM | POA: Diagnosis not present

## 2014-05-11 DIAGNOSIS — I482 Chronic atrial fibrillation: Secondary | ICD-10-CM | POA: Diagnosis not present

## 2014-05-11 DIAGNOSIS — E782 Mixed hyperlipidemia: Secondary | ICD-10-CM | POA: Diagnosis not present

## 2014-05-13 NOTE — Op Note (Signed)
PATIENT NAME:  Alexis Barry, Alexis Barry MR#:  [id number] DATE OF BIRTH:  [birth year]  DATE OF PROCEDURE:  09/01/2012  PREOPERATIVE DIAGNOSIS: Left breast cancer, status post neoadjuvant chemotherapy.   POSTOPERATIVE DIAGNOSIS: Left breast cancer, status post neoadjuvant chemotherapy.   PROCEDURE PERFORMED:  1.  Left mastectomy.  2.  Left axillary exploration with only minimally positive sentinel lymph nodes and level 1 node excision.   SURGEON: Marlyce Huge, MD  ESTIMATED BLOOD LOSS: 250 mL.   COMPLICATIONS: None.   SPECIMEN: Level 1 axillary lymph nodes, probable sentinel node and left breast.   ANESTHESIA: General.   INDICATIONS FOR SURGERY: Alexis Barry is a pleasant 80 year old female, who presented with an ulcerative lesion in her inferior outer left breast that was biopsied and shown to be adenocarcinoma. She thus underwent neoadjuvant chemotherapy and went to the operating room suite for left mastectomy and sentinel lymph node biopsy.   DETAILS OF PROCEDURE: Informed consent was obtained. Alexis Barry did go to radiology and had radio-labeled colloid injected into the peritumoral region of her left breast. She was then brought to the operating room suite. She was placed supine on the operating room table. She was induced. Endotracheal tube was placed, general anesthesia was administered. Her left breast was sterilized. A timeout was performed correctly identifying the patient name, operative site and procedure to be performed. I then injected 5 mL of 2 to 4 diluted in normal saline, methylene blue. This was circulated into the breast for approximately 5 minutes using rigorous motion. I then prepped her left breast and draped in standard surgical fashion. A timeout was then again performed. I then measured and ellipsed past the nipple areolar complex as well as the now healed skin lesion at the lateral inferior aspect of her left breast. It did go quite wide and therefore I did have to go  wider than usual. I then, using scalpel and Bovie electrocautery, incised this ellipse. I then created a superior, medial, inferior and lateral flaps. The breast was mildly scarred left of the areola and I did have thin flaps in those areas to ensure that all tumor was removed. The breast was then taken off  with the pectoral fascia medial to the sternum, superiorly to the clavicle, inferior to the inframammary fold and laterally to the edge of the major. It was removed. It was oriented with a superior stitch as well as a lateral stitch. I then attempted sentinel lymph node biopsy and did encounter a wad of weakly positive nodes with no node in particular positive in the level 1 area. I then explored the entire axilla up to the axillary vein and artery, medial to the serratus fascia, posterior to the latissimus dorsi and inferior to the chest wall. I  again only had weakly positive signals in sentinel node, therefore I elected to send the entire wad of level 1lymph nodes, which appeared to be positive with the handheld radiation counter. This was sent to pathology. I did not feel with the patient's age and poor functional status and in the absence of any palpable lymph nodes, that an entire lymph node dissection was warranted at this time.   Following this, I looked at the wound bed. Hemostasis was obtained. I then placed two 7-French JPs, 1 across the pectoralis muscle and 1 into the axilla, which came out in the to the left abdominal skin. These were secured with two 3-0 nylon sutures. I then closed the incision with a running 3-0 Vicryl and then a  subcuticular 4-0 Monocryl. Steri-Strips were then placed over the wound. Fluffs were then placed over the incision and a Kerlix was wrapped around the body and then an Ace bandage. The patient was then awoken, extubated and brought to the postanesthesia care unit. There were no immediate complications. Needle, sponge, and instrument counts were correct at the end of  the procedure.   ____________________________ Alexis Barry. Tezra Mahr, MD cal:aw D: 09/02/2012 07:49:17 ET T: 09/02/2012 09:05:13 ET JOB#: [id number]  cc: Harrell Gave A. Arantxa Piercey, MD, <Dictator> Floyde Parkins MD ELECTRONICALLY SIGNED 09/04/2012 10:53

## 2014-05-13 NOTE — Consult Note (Signed)
Reason for Visit: This 80 year old Female patient presents to the clinic for initial evaluation of  breast cancer .   Referred by Dr. Grayland Ormond.  Diagnosis:  Chief Complaint/Diagnosis   38-year-old female with stage IIIB invasive mammary carcinoma (T4, N1, M0) ER/PR positive HER-2/neu negative status post neoadjuvant chemotherapy then left modified radical mastectomy.  Pathology Report pathology report reviewed   Imaging Report mammograms PET/CT scan ultrasound reviewed   Referral Report clinical notes reviewed   Planned Treatment Regimen left chest wall peripheral lymphatic radiation   HPI   patient is a 80 year old female who presented with a self discovered left breast mass.initial mammograms were positive and CT scan confirmed a 3.3 x 2 cm left breast mass with overlying skin thickening. Was also bilateral pleural effusions left greater than right and an incidental right substernal thyroid.PET CT scan confirmed hypermetabolic breast mass no evidence to suggest metastatic disease. She underwent pleural biopsy as well as thoracentesis with no evidence of malignancy. She then underwent neoadjuvant chemotherapy consisting of Cytoxan Adriamycin plus Taxol. She then underwent a left modified radical mastectomy. There was 0.8 cm of residual disease with positive skin involvement. Margins were clear at 5 mm superior and inferior. One sentinel lymph node had a 2.3 mm focus of metastatic disease he was no lymph nodes sampled. Tumor was strongly ER-positive borderline PR positive HER-2/neu not overexpressed. She is now complete her chemotherapy and has had slight peripheral neuropathy mostly in her feet and hands. She does not does have a Port-A-Cath placed in the left anterior chest wall.  Past Hx:    HTN:    CHF:    afib:    goiter:   Past, Family and Social History:  Past Medical History positive   Cardiovascular atrial fibrillation; hypertension   Endocrine thyroid goiter    Neurological/Psychiatric Alzheimer's; early dementia   Past Surgical History thyroid surgery 1977   Family History positive   Family History Comments family history of congestive heart failure hypertension   Social History noncontributory   Additional Past Medical and Surgical History accompanied by her granddaughter today   Allergies:   No Known Allergies:   Home Meds:  Home Medications: Medication Instructions Status  furosemide 20 mg oral tablet 1 tab(s) orally 2 times a day Active  acetaminophen-HYDROcodone 325 mg-5 mg oral tablet 1 tab(s) orally every 4 hours, As needed, pain Active  metoprolol tartrate 50 mg oral tablet 1 tab(s) orally 2 times a day Active  Aspirin Low Dose 81 mg oral tablet 4 tab(s) orally once a day (in the morning) Active  Cartia XT 240 mg/24 hours oral capsule, extended release 1 cap(s) orally once a day (in the morning) Active  potassium chloride 20 mEq oral tablet, extended release 1 tab(s) orally 2 times a day Active   Review of Systems:  General negative   Performance Status (ECOG) 0   Skin see HPI   Breast see HPI   Ophthalmologic negative   ENMT negative   Respiratory and Thorax negative   Cardiovascular negative   Gastrointestinal negative   Genitourinary negative   Musculoskeletal negative   Neurological negative   Psychiatric negative   Hematology/Lymphatics negative   Endocrine negative   Allergic/Immunologic negative   Nursing Notes:  Nursing Vital Signs and Chemo Nursing Nursing Notes: *CC Vital Signs Flowsheet:   17-Sep-14 14:11  Temp Temperature 98  Pulse Pulse 111  Respirations Respirations 20  SBP SBP 132  DBP DBP 76  Current Weight (kg) (kg) 73.6  Physical Exam:  General/Skin/HEENT:  General normal   Eyes normal   ENMT normal   Head and Neck normal   Additional PE well-developed elderly female in NAD. Lungs are clear to A&P cardiac examination shows irregular heart rate lungs are clear to 8  A&P. She status post left modified radical mastectomy. Chest walls well-healed. No evidence of mass or nodularity in the mastectomy scar is noted. Right breast is free of dominant mass or nodularity into position examined. No axillary or supraclavicular adenopathy is appreciated. She is a Port-A-Cath placed in left anterior chest wall.   Breasts/Resp/CV/GI/GU:  Respiratory and Thorax normal   Cardiovascular normal   Gastrointestinal normal   Genitourinary normal   MS/Neuro/Psych/Lymph:  Musculoskeletal normal   Neurological normal   Lymphatics normal   Other Results:  Radiology Results: [place]:    18-Nov-13 16:21, US Breast Left  US Breast Left   REASON FOR EXAM:    breat mass  COMMENTS:       PROCEDURE: [place]  - US BREAST LEFT  - Dec 09 2011  4:21PM     RESULT: Targeted left breast ultrasound demonstrates an irregularly   marginated 3.40 x 2.25 x 3.71 cm mass 3 cm from the nipple between the 5   o'clock and 6 o'clock region in the anterior lower outer left breast.   There are areas of calcification. Vascular flow is noted. There is   overlying skin thickening. Areas of ulceration are seen in the skin. The   findings are very concerning for underlying malignancy of the breast.    IMPRESSION:  Please see above.    Thank you for the opportunity to contribute to the care of your patient.   Dictation Site: 1        Verified By: Sundra Aland, M.D., MD  LabUnknown:    18-Nov-13 14:57, Digital Diagnostic Mammogram Bilateral  PACS Image     [phone] 16:21, US Breast Left  PACS Image     04-Dec-13 10:45, PET/CT Scan Breast CA Stage/Restaging  [id number]   REASON FOR EXAM:    Initial stage breast cancer  COMMENTS:       PROCEDURE: PET - PET/CT RESTG BREAST CA  - Dec 25 2011 10:45AM     RESULT: Indication: Breast cancer  Radiopharmaceutical: 12.02 mCi F18-FDG, intravenously.    Technique: Imaging was performed from the skull base to the mid-thigh   using routine PET/CT  acquisition protocol.    Injection site: left antecubital  Time of FDG injection: 0915 hours  Serum glucose: 115 mg/dL   Time of imaging: 1016 hours  Comparison studies: NONE    Findings:    HEAD AND NECK:     There is abnormal hypermetabolic activity within a 12 mm thyroid nodule   in the isthmus of the thyroid gland with an SUV max of 4.7 and an SUV   average of 2.6 .    CHEST:    There is a moderate right pleural effusion. There is a large substernal   thyroid gland again noted measuring approximately 9.4 x 1.7 x 10 cm   without significant metabolic activity. Again noted is a spiculated 4.4     cm hypermetabolic left breast mass with overlying skin thickening with an   SUVmax of 5.3 and an SUV average of 3.2 .    The heart size is normal. There is no pericardial effusion.     There no pathologically enlarged mediastinal, hilar, or axillary lymph   nodes.  The osseous structures demonstrate no focal abnormality.     ABDOMEN/PELVIS:    The liver demonstrates no focal abnormality. There is cholelithiasis..   The spleen demonstrates no focal abnormality. The kidneys, adrenal   glands, pancreas are normal. The bladder is unremarkable.   The unopacified bowel is unremarkable. There is no pneumoperitoneum,   pneumatosis, or portal venous gas. There is no abdominal or pelvic free   fluid. There is no lymphadenopathy.     The abdominal aorta is normal in caliber.    The osseous structures are unremarkable.    IMPRESSION:     1. Hypermetabolic left breast mass most consistent with malignancy. There   are no distant foci of hypermetabolic activity to suggest metastatic   disease.    2. There is abnormal hypermetabolic activity within a 12 mm thyroid     nodule in the isthmus of the thyroid gland. A differential diagnoses   includes hyperfunctioning nodule versus thyroid cancer. Recommend further   evaluation with ultrasound.    3. Nonspecific moderate right  pleural effusion.    Dictation Site: 1        Verified By: Jennette Banker, M.D., MD  PACS [place]:    [phone] 14:57, Digital Diagnostic Mammogram Bilateral  Digital Diagnostic Mammogram Bilateral   REASON FOR EXAM:    breast mass  COMMENTS:       PROCEDURE: MAM - MAM DGTL DIAGNOSTIC MAMMO W/CAD  - Dec 09 2011  2:57PM     RESULT: The patient has no previous exam for comparison.    There is a moderately dense parenchymal pattern. There is a largemass   with irregular margins and scattered calcifications in the mass and   surrounding soft tissue in the anterior outer inferior left breast   projecting near the level of the nipple on the ML view. There is a   slightly increased area of parenchymal density in the upper outer mid   right breast without definite malignant foci. Within the left breast   laterally lateral to the mass there are some additional branched   calcifications concerning for spread of disease away from the primary     malignancy. A small satellite lesion is not excluded. This projects   lateral and slightly posterior to the primary malignancy.    IMPRESSION:      BI-RADS: Category 4 - Suspicious Abnormality.     Findings very concerning for malignant disease in the left breast. Given   the size and extent with extensive calcifications, spread of disease from   the primary mass may have occurred.    A NEGATIVE MAMMOGRAM REPORT DOES NOT PRECLUDE BIOPSY OR OTHER EVALUATION   OF A CLINICALLY PALPABLE OR OTHERWISE SUSPICIOUS MASS OR LESION. BREAST   CANCER MAY NOT BE DETECTED BY MAMMOGRAPHY IN UP TO 10% OF CASES.  Thank you for the opportunity to contribute to the care of your patient.     Dictation Site: 1        Verified By: Sundra Aland, M.D., MD   Relevent Results:   Relevant Scans and Labs PET/CT scan and mammograms and ultrasound were all reviewed.   Assessment and Plan: Impression:   tage IIIB invasive mammary carcinoma  left breast status post neoadjuvant chemotherapy and modified radical mastectomy for ER/PR positive HER-2/neu negative disease. Plan:   at this time I have recommended going ahead with adjuvant radiation therapy to her left chest wall and peripheral lymphatics based on her  poor prognostic factors including skin involvement and single lymph node removed with metastatic disease. Would plan on delivering 5000 cGy to her left chest wall and peripheral lymphatics boosting or scar another 1600 cGy with electron beam. Risks and benefits of treatment including skin wall fibrosis inclusion of some superficial lung, alteration of blood counts, and possibly lymphedema of her left upper extremity were all explained in detail to the patient and her granddaughter. They both seem to comprehend my treatment plan well. I have set her up and discuss the case personally with Dr. Rexene Edison we will be removing her Port-A-Cath in the next day or 2. Will allow about a week and half of healing after that and begin treatment planning at that time. CT simulation appointment was given to the patient. Patient is to call with any concerns or other questions.  I would like to take this opportunity to thank you for allowing me to continue to participate in this patient's care.  CC Referral:  cc: Dr. Rexene Edison, Dr. Radford Pax   Electronic Signatures: Baruch Gouty, Roda Shutters (MD)  (Signed 17-Sep-14 15:09)  Authored: HPI, Diagnosis, Past Hx, PFSH, Allergies, Home Meds, ROS, Nursing Notes, Physical Exam, Other Results, Relevent Results, Encounter Assessment and Plan, CC Referring Physician   Last Updated: 17-Sep-14 15:09 by Armstead Peaks (MD)

## 2014-05-13 NOTE — Op Note (Signed)
PATIENT NAME:  Alexis Barry, Alexis Barry MR#:  [id number] DATE OF BIRTH:  1934/02/01  DATE OF PROCEDURE:  10/09/2012  PREOPERATIVE DIAGNOSIS: Left breast cancer. Positive axillary lymph nodes, need for chemoradiation.   POSTOPERATIVE DIAGNOSIS:  Left breast cancer. Positive axillary lymph nodes, need for chemoradiation.   PROCEDURE PERFORMED:  Port-A-Cath removal.   ESTIMATED BLOOD LOSS:  5 mL.  SPECIMEN:   Excised port.   ANESTHESIA: MAC.   INDICATION FOR SURGERY:  Ms. Knighton is a pleasant 80 year old female who presented with breast cancer status post left mastectomy and sentinel lymph node biopsy. She returned with  positive lymph nodes and decision was made for chemoradiation to her left axilla per Dr. Olena Leatherwood request. I was asked to remove Port-A-Cath to allow this to occur.   DETAILS OF THE SURGERY: Informed consent was obtained. Ms. Bastien was brought to the operating room suite. She was laid supine on the operating room table. She was given IV sedation. Her left chest was then prepped and draped in  standard surgical fashion. A timeout was then performed correctly identifying the patient name, operative site and procedure to be performed. A transverse incision was made over the Port-A-Cath. It was dissected  from the surrounding tissue.  The Port-A-Cath was exposed. The 2 sutures which were securing the Port-A-Cath to the chest wall were clipped and the port was removed. Prior to removal, a 3-0 Vicryl suture was placed at the catheter entry site under the clavicle, and the patient was told to take a deep breath and exhale slowly, and the catheter was then removed. There capsule was then removed where the catheter had been. The wound was then irrigated. It was made hemostatic. It was then closed in two layers; a 3-0 deep dermal Vicryl layer, interrupted layer and a running 4-0 Monocryl subcuticular. Dermabond was then placed over the wound. The patient was then awoken, extubated and brought to the  postanesthesia care unit. There were no immediate complications. Needle, sponge and instrument counts were correct at the end of the procedure.    ____________________________ Glena Norfolk. Anaysha Andre, MD cal:dmm D: 10/09/2012 08:42:00 ET T: 10/09/2012 09:37:57 ET JOB#: [id number]  cc: Harrell Gave A. Mahrosh Donnell, MD, <Dictator> Floyde Parkins MD ELECTRONICALLY SIGNED 10/10/2012 19:22

## 2014-05-28 ENCOUNTER — Emergency Department: Payer: Medicare Other

## 2014-05-28 ENCOUNTER — Observation Stay
Admission: EM | Admit: 2014-05-28 | Discharge: 2014-05-29 | Disposition: A | Discharge status: Home / Self Care | Payer: Medicare Other | Attending: Internal Medicine | Admitting: Internal Medicine

## 2014-05-28 ENCOUNTER — Encounter: Payer: Self-pay | Admitting: Emergency Medicine

## 2014-05-28 ENCOUNTER — Other Ambulatory Visit: Payer: Self-pay

## 2014-05-28 DIAGNOSIS — I4891 Unspecified atrial fibrillation: Secondary | ICD-10-CM | POA: Diagnosis not present

## 2014-05-28 DIAGNOSIS — I2510 Atherosclerotic heart disease of native coronary artery without angina pectoris: Secondary | ICD-10-CM | POA: Diagnosis not present

## 2014-05-28 DIAGNOSIS — I481 Persistent atrial fibrillation: Secondary | ICD-10-CM | POA: Diagnosis not present

## 2014-05-28 DIAGNOSIS — E876 Hypokalemia: Secondary | ICD-10-CM | POA: Diagnosis not present

## 2014-05-28 DIAGNOSIS — Z853 Personal history of malignant neoplasm of breast: Secondary | ICD-10-CM | POA: Diagnosis not present

## 2014-05-28 DIAGNOSIS — Z7901 Long term (current) use of anticoagulants: Secondary | ICD-10-CM | POA: Insufficient documentation

## 2014-05-28 DIAGNOSIS — Z79899 Other long term (current) drug therapy: Secondary | ICD-10-CM | POA: Diagnosis not present

## 2014-05-28 DIAGNOSIS — I10 Essential (primary) hypertension: Secondary | ICD-10-CM | POA: Insufficient documentation

## 2014-05-28 DIAGNOSIS — R079 Chest pain, unspecified: Secondary | ICD-10-CM | POA: Diagnosis not present

## 2014-05-28 DIAGNOSIS — E049 Nontoxic goiter, unspecified: Secondary | ICD-10-CM | POA: Insufficient documentation

## 2014-05-28 DIAGNOSIS — R918 Other nonspecific abnormal finding of lung field: Secondary | ICD-10-CM | POA: Diagnosis not present

## 2014-05-28 DIAGNOSIS — R0789 Other chest pain: Secondary | ICD-10-CM | POA: Insufficient documentation

## 2014-05-28 DIAGNOSIS — Z9012 Acquired absence of left breast and nipple: Secondary | ICD-10-CM | POA: Insufficient documentation

## 2014-05-28 HISTORY — DX: Essential (primary) hypertension: I10

## 2014-05-28 HISTORY — DX: Unspecified atrial fibrillation: I48.91

## 2014-05-28 HISTORY — DX: Malignant (primary) neoplasm, unspecified: C80.1

## 2014-05-28 LAB — BASIC METABOLIC PANEL
ANION GAP: 10 (ref 5–15)
BUN: 10 mg/dL (ref 6–20)
CO2: 26 mmol/L (ref 22–32)
Calcium: 8.8 mg/dL — ABNORMAL LOW (ref 8.9–10.3)
Chloride: 97 mmol/L — ABNORMAL LOW (ref 101–111)
Creatinine, Ser: 0.65 mg/dL (ref 0.44–1.00)
GFR calc non Af Amer: >60 mL/min (ref >60)
Glucose, Bld: 132 mg/dL — ABNORMAL HIGH (ref 65–99)
POTASSIUM: 3.4 mmol/L — AB (ref 3.5–5.1)
Sodium: 133 mmol/L — ABNORMAL LOW (ref 135–145)

## 2014-05-28 LAB — MAGNESIUM: MAGNESIUM: 1.8 mg/dL (ref 1.7–2.4)

## 2014-05-28 LAB — TROPONIN I
Troponin I: <0.03 ng/mL (ref <0.031)
Troponin I: <0.03 ng/mL (ref <0.031)
Troponin I: <0.03 ng/mL (ref <0.031)

## 2014-05-28 LAB — CREATININE, SERUM
Creatinine, Ser: 0.73 mg/dL (ref 0.44–1.00)
GFR calc Af Amer: >60 mL/min (ref >60)
GFR calc non Af Amer: >60 mL/min (ref >60)

## 2014-05-28 LAB — CBC
HCT: 43.6 % (ref 35.0–47.0)
Hemoglobin: 14.5 g/dL (ref 12.0–16.0)
MCH: 30.5 pg (ref 26.0–34.0)
MCHC: 33.2 g/dL (ref 32.0–36.0)
MCV: 92 fL (ref 80.0–100.0)
Platelets: 169 10*3/uL (ref 150–440)
RBC: 4.74 MIL/uL (ref 3.80–5.20)
RDW: 13.7 % (ref 11.5–14.5)
WBC: 10.5 10*3/uL (ref 3.6–11.0)

## 2014-05-28 LAB — CK TOTAL AND CKMB (NOT AT ARMC)
CK, MB: 1.4 ng/mL (ref 0.5–5.0)
Relative Index: INVALID (ref 0.0–2.5)
Total CK: 31 U/L — ABNORMAL LOW (ref 38–234)

## 2014-05-28 MED ORDER — APIXABAN 5 MG PO TABS
5.0000 mg | ORAL_TABLET | Freq: Two times a day (BID) | ORAL | Status: DC
  Administered 2014-05-28 – 2014-05-29 (×2): 5 mg via ORAL
  Filled 2014-05-28 (×2): qty 1

## 2014-05-28 MED ORDER — DILTIAZEM HCL 25 MG/5ML IV SOLN
20.0000 mg | Freq: Once | INTRAVENOUS | Status: AC
  Administered 2014-05-28: 20 mg via INTRAVENOUS

## 2014-05-28 MED ORDER — ONDANSETRON HCL 4 MG/2ML IJ SOLN: 4.0000 mg | Freq: Four times a day (QID) | INTRAMUSCULAR | Status: DC | PRN

## 2014-05-28 MED ORDER — DILTIAZEM HCL 25 MG/5ML IV SOLN
INTRAVENOUS | Status: AC
  Filled 2014-05-28: qty 5

## 2014-05-28 MED ORDER — DOCUSATE SODIUM 100 MG PO CAPS: 100.0000 mg | ORAL_CAPSULE | Freq: Two times a day (BID) | ORAL | Status: DC | PRN

## 2014-05-28 MED ORDER — SENNA 8.6 MG PO TABS: 1.0000 | ORAL_TABLET | Freq: Every day | ORAL | Status: DC | PRN

## 2014-05-28 MED ORDER — ALBUTEROL SULFATE (2.5 MG/3ML) 0.083% IN NEBU
2.5000 mg | INHALATION_SOLUTION | RESPIRATORY_TRACT | Status: DC | PRN
Start: 2014-05-28 — End: 2014-05-29

## 2014-05-28 MED ORDER — POTASSIUM CHLORIDE CRYS ER 20 MEQ PO TBCR
40.0000 meq | EXTENDED_RELEASE_TABLET | Freq: Once | ORAL | Status: AC
  Administered 2014-05-28: 40 meq via ORAL
  Filled 2014-05-28: qty 2

## 2014-05-28 MED ORDER — LETROZOLE 2.5 MG PO TABS
2.5000 mg | ORAL_TABLET | Freq: Every day | ORAL | Status: DC
  Administered 2014-05-28: 2.5 mg via ORAL
  Filled 2014-05-28 (×2): qty 1

## 2014-05-28 MED ORDER — HEPARIN SODIUM (PORCINE) 5000 UNIT/ML IJ SOLN: 5000.0000 [IU] | Freq: Three times a day (TID) | INTRAMUSCULAR | Status: DC

## 2014-05-28 MED ORDER — ACETAMINOPHEN 650 MG RE SUPP: 650.0000 mg | Freq: Four times a day (QID) | RECTAL | Status: DC | PRN

## 2014-05-28 MED ORDER — IOHEXOL 350 MG/ML SOLN
75.0000 mL | Freq: Once | INTRAVENOUS | Status: AC | PRN
  Administered 2014-05-28: 75 mL via INTRAVENOUS

## 2014-05-28 MED ORDER — METOPROLOL TARTRATE 50 MG PO TABS
50.0000 mg | ORAL_TABLET | Freq: Two times a day (BID) | ORAL | Status: DC
  Administered 2014-05-28 – 2014-05-29 (×2): 50 mg via ORAL
  Filled 2014-05-28 (×2): qty 1

## 2014-05-28 MED ORDER — ACETAMINOPHEN 325 MG PO TABS: 650.0000 mg | ORAL_TABLET | Freq: Four times a day (QID) | ORAL | Status: DC | PRN

## 2014-05-28 MED ORDER — DILTIAZEM HCL ER COATED BEADS 180 MG PO CP24
180.0000 mg | ORAL_CAPSULE | Freq: Every day | ORAL | Status: DC
  Administered 2014-05-28 – 2014-05-29 (×2): 180 mg via ORAL
  Filled 2014-05-28 (×2): qty 1

## 2014-05-28 NOTE — ED Notes (Signed)
Patient reports waking this morning around 7am with sharp pains to center of chest, reports at this time pain is "off and on", normally when she takes a deep breath.

## 2014-05-28 NOTE — ED Provider Notes (Addendum)
CT angiogram PE study negative for pulmonary emboli. In addition patient with no complaints at present and particularly no chest pain or shortness of breath. Of note cardiac enzymes negative 2 patient now rate controlled. However patient still has ongoing chest pain as such patient discussed with Dr. Tammy Sours study and will be admitted to the hospital for further cardiac evaluation  Gregor Hams, MD 05/28/14 [place], MD 05/28/14 1758

## 2014-05-28 NOTE — ED Notes (Signed)
Pt given water 

## 2014-05-28 NOTE — ED Provider Notes (Signed)
[facility] Emergency Department Provider Note  ____________________________________________  Time seen: 11 AM  I have reviewed the triage vital signs and the nursing notes.   HISTORY  Chief Complaint Chest Pain    HPI Alexis Barry is a 80 y.o. female presents with chest pain started at 7 AM this morning. She reports she felt well yesterday. She notes the pain is left of her sternum and central and is sharp in nature. She denies any recent travel pain and history of DVTs. No fevers chills, no cough. Reports a history of atrial fibrillation and is on eliquis which she takes daily. She denies injury and scrubs the pain is moderate     Past Medical History  Diagnosis Date  . Atrial fibrillation   . Hypertension     There are no active problems to display for this patient.   History reviewed. No pertinent past surgical history.  No current outpatient prescriptions on file.  Allergies Review of patient's allergies indicates no known allergies.  History reviewed. No pertinent family history.  Social History History  Substance Use Topics  . Smoking status: Never Smoker   . Smokeless tobacco: Never Used  . Alcohol Use: No    Review of Systems  Constitutional: Negative for fever. Eyes: Negative for visual changes. ENT: Negative for sore throat. Cardiovascular: Positive for chest pain Respiratory: Negative for shortness of breath. Gastrointestinal: Negative for abdominal pain, vomiting and diarrhea. Genitourinary: Negative for dysuria. Musculoskeletal: Negative for back pain. Skin: Negative for rash. Neurological: Negative for headaches, focal weakness or numbness. Psychiatric: Anxious  10-point ROS otherwise negative.  ____________________________________________   PHYSICAL EXAM:  VITAL SIGNS: ED Triage Vitals  Enc Vitals Group     BP 05/28/14 1052 139/103 mmHg     Pulse Rate 05/28/14 1052 126     Resp 05/28/14 1052 18      Temp 05/28/14 1052 97.7 F (36.5 C)     Temp Source 05/28/14 1052 Oral     SpO2 05/28/14 1052 95 %     Weight 05/28/14 1052 176 lb (79.833 kg)     Height 05/28/14 1052 5\' 7"  (1.702 m)     Head Cir --      Peak Flow --      Pain Score 05/28/14 1053 3     Pain Loc --      Pain Edu? --      Excl. in [place]? --      Constitutional: Alert and oriented. Well appearing and in no distress. Eyes: Conjunctivae are normal. PERRL. Normal extraocular movements. ENT   Head: Normocephalic and atraumatic.   Nose: No congestion/rhinnorhea.   Mouth/Throat: Mucous membranes are moist.   Neck: No stridor. Hematological/Lymphatic/Immunilogical: No cervical lymphadenopathy. Cardiovascular: Tachycardic, irregularly irregular rhythm. Normal and symmetric distal pulses are present in all extremities. Respiratory: Normal respiratory effort without tachypnea nor retractions. Breath sounds are clear and equal bilaterally. No wheezes/rales/rhonchi. Gastrointestinal: Soft and nontender. No distention. There is no CVA tenderness. Genitourinary: deferred Musculoskeletal: Nontender with normal range of motion in all extremities. No joint effusions.  No lower extremity tenderness nor edema. No tenderness to palpation of bilateral calves Neurologic:  Normal speech and language. No gross focal neurologic deficits are appreciated. Speech is normal.  Skin:  Skin is warm, dry and intact. No rash noted. Psychiatric: Mood and affect are normal. Speech and behavior are normal. Patient exhibits appropriate insight and judgment.  ____________________________________________    LABS (pertinent positives/negatives)  unremarkable  ____________________________________________  EKG  ED ECG REPORT   Date: 05/28/2014  EKG Time: 10:55 AM  Rate: 146  Rhythm: nonspecific ST and T waves changes, atrial fibrillation, rate 146  Axis: Normal  Intervals:none  ST&T Change: Nonspecific ST  changes   ____________________________________________    RADIOLOGY  CTA pending  ____________________________________________   PROCEDURES  Procedure(s) performed:   Critical Care performed: Yes CRITICAL CARE Performed by: Lavonia Drafts   Total critical care time: 30  Critical care time was exclusive of separately billable procedures and treating other patients.  Critical care was necessary to treat or prevent imminent or life-threatening deterioration.  Critical care was time spent personally by me on the following activities: development of treatment plan with patient and/or surrogate as well as nursing, discussions with consultants, evaluation of patient's response to treatment, examination of patient, obtaining history from patient or surrogate, ordering and performing treatments and interventions, ordering and review of laboratory studies, ordering and review of radiographic studies, pulse oximetry and re-evaluation of patient's condition.   ____________________________________________   INITIAL IMPRESSION / ASSESSMENT AND PLAN / ED COURSE  Pertinent labs & imaging results that were available during my care of the patient were reviewed by me and considered in my medical decision making (see chart for details).  ----------------------------------------- 11:24 AM on 05/28/2014 -----------------------------------------  Initial impression is atrial fibrillation with rapid ventricular response causing chest discomfort. The pressure stable. Will give 20 mg of IV Cardizem and repeat EKG  ____________________________________________ ----------------------------------------- 3:17 PM on 05/28/2014 -----------------------------------------  Heart rate has stayed within the normal range however patient is Now complaining of pain when she takes a deep breath. Given onset of tachycardia and mild shortness of breath with pleurisy we'll obtain CTA. Patient signed out to Dr.  Owens Shark, he will follow up results  FINAL CLINICAL IMPRESSION(S) / ED DIAGNOSES  Final diagnoses:  Atrial fibrillation with rapid ventricular response     Lavonia Drafts, MD 05/28/14 [phone]

## 2014-05-28 NOTE — H&P (Signed)
[place] at [place] NAME: Alexis Barry    MR#:  [id number]  DATE OF BIRTH:  May 20, 1934  DATE OF ADMISSION:  05/28/2014  PRIMARY CARE PHYSICIAN: Leta Baptist, M.D.  REQUESTING/REFERRING PHYSICIAN: Dr. Owens Shark  CHIEF COMPLAINT:   Chief Complaint  Patient presents with  . Chest Pain    HISTORY OF PRESENT ILLNESS:  Alexis Barry  is a 79 y.o. female with a known history of hypertension and atrial fibrillation on eliquis presents to the hospital secondary to chest pain that started this morning. Patient follows with Dr. Nehemiah Massed for her A. fib. Known history of coronary artery disease. She has been in her normal state of health up until this morning and she woke up with severe chest pain that was radiating to the left side of her chest. Denies any diaphoresis nausea. The pain was getting worse with deep inspiration. No reflux disease. No recent fevers or chills or cold or congestion. Chest x-ray normal in the emergency room. Cardiac troponins were negative 2. She is being admitted under observation for chest pain. She is noted to be in A. fib and heart rate was elevated in the 120s requiring 2 doses of IV Cardizem pushes. Currently heart rate is in the 100-110 range not on any Cardizem drip.  PAST MEDICAL HISTORY:   Past Medical History  Diagnosis Date  . Atrial fibrillation   . Hypertension     PAST SURGICAL HISTORY:  History reviewed. No pertinent past surgical history.  SOCIAL HISTORY:   History  Substance Use Topics  . Smoking status: Never Smoker   . Smokeless tobacco: Never Used  . Alcohol Use: No    FAMILY HISTORY:  History reviewed. No pertinent family history.  DRUG ALLERGIES:  No Known Allergies  REVIEW OF SYSTEMS:   Review of Systems  Constitutional: Negative for fever, chills and weight loss.  HENT: Negative for congestion and tinnitus.   Eyes: Negative for blurred vision and double vision.  Respiratory:  Negative for cough, hemoptysis and shortness of breath.   Cardiovascular: Positive for chest pain and palpitations. Negative for orthopnea and PND.  Gastrointestinal: Negative for heartburn, nausea, vomiting, abdominal pain and diarrhea.  Genitourinary: Negative for dysuria, urgency and frequency.  Musculoskeletal: Negative for myalgias, back pain and neck pain.  Skin: Negative for rash.  Neurological: Negative for dizziness, tingling, tremors and headaches.  Endo/Heme/Allergies: Does not bruise/bleed easily.  Psychiatric/Behavioral: Negative for depression and hallucinations. The patient is not nervous/anxious.      MEDICATIONS AT HOME:   Prior to Admission medications   Medication Sig Start Date End Date Taking? Authorizing Provider  apixaban (ELIQUIS) 5 MG TABS tablet Take 5 mg by mouth 2 (two) times daily.   Yes Historical Provider, MD  diltiazem (CARDIZEM CD) 180 MG 24 hr capsule Take 180 mg by mouth daily.   Yes Historical Provider, MD  furosemide (LASIX) 20 MG tablet Take 40 mg by mouth every morning.   Yes Historical Provider, MD  letrozole (FEMARA) 2.5 MG tablet Take 2.5 mg by mouth daily.   Yes Historical Provider, MD  metoprolol (LOPRESSOR) 50 MG tablet Take 50 mg by mouth 2 (two) times daily.   Yes Historical Provider, MD      VITAL SIGNS:  Blood pressure 126/77, pulse 105, temperature 97.7 F (36.5 C), temperature source Oral, resp. rate 26, height 5\' 7"  (1.702 m), weight 79.833 kg (176 lb), SpO2 94 %.  PHYSICAL EXAMINATION:   Physical Exam  GENERAL:  80 y.o.-year-old patient lying in the bed with no acute distress.  EYES: Pupils equal, round, reactive to light and accommodation. No scleral icterus. Extraocular muscles intact.  HEENT: Head atraumatic, normocephalic. Oropharynx and nasopharynx clear.  NECK:  Supple, no jugular venous distention. No thyroid enlargement, no tenderness.  LUNGS: Normal breath sounds bilaterally, no wheezing, rales,rhonchi or crepitation.  No use of accessory muscles of respiration.  CARDIOVASCULAR: S1, S2 normal. 3/6 systolic murmur present. No rubs, gallops S/p Left mastectomy. No local skin changes on inspection  ABDOMEN: Soft, nontender, nondistended. Bowel sounds present. No organomegaly or mass.  EXTREMITIES: No pedal edema, cyanosis, or clubbing.  NEUROLOGIC: Cranial nerves II through XII are intact. Muscle strength 5/5 in all extremities. Sensation intact. Gait not checked.  PSYCHIATRIC: The patient is alert and oriented x 3.  SKIN: No obvious rash, lesion, or ulcer.   LABORATORY PANEL:   CBC  Recent Labs Lab 05/28/14 1122  WBC 10.5  HGB 14.5  HCT 43.6  PLT 169   ------------------------------------------------------------------------------------------------------------------  Chemistries   Recent Labs Lab 05/28/14 1122  NA 133*  K 3.4*  CL 97*  CO2 26  GLUCOSE 132*  BUN 10  CREATININE 0.65  CALCIUM 8.8*   ------------------------------------------------------------------------------------------------------------------  Cardiac Enzymes  Recent Labs Lab 05/28/14 1440  TROPONINI <0.03   ------------------------------------------------------------------------------------------------------------------  RADIOLOGY:  Ct Angio Chest Pe W/cm &/or Wo Cm  05/28/2014   CLINICAL DATA:  Chest pain since this morning  EXAM: CT ANGIOGRAPHY CHEST WITH CONTRAST  TECHNIQUE: Multidetector CT imaging of the chest was performed using the standard protocol during bolus administration of intravenous contrast. Multiplanar CT image reconstructions and MIPs were obtained to evaluate the vascular anatomy.  CONTRAST:  55mL OMNIPAQUE IOHEXOL 350 MG/ML SOLN  COMPARISON:  12/08/2011  FINDINGS: The lungs are well aerated bilaterally. Mild nodularity to the pleural space on the right is noted. These changes consistent with a prior history of pleural studding and talc pleurodesis. No focal confluent infiltrate is seen. Some  patchy infiltrative changes noted within the lingula. No sizable effusion is noted. No pneumothorax is seen. No parenchymal nodules are noted.  The thoracic inlet demonstrates evidence of a large intrathoracic goiter stable from the prior exam. It displaces the brachiocephalic vessels on the right the thoracic aorta is otherwise within normal limits with some calcifications. No aneurysmal dilatation is seen. Coronary calcifications are again noted. The pulmonary artery demonstrates a normal branching pattern. No filling defects to suggest pulmonary emboli are identified. No significant hilar or mediastinal adenopathy is noted. Scanning into the upper abdomen reveals some hepatic cystic change. No acute bony abnormality is seen.  Review of the MIP images confirms the above findings.  IMPRESSION: Chronic intrathoracic goiter stable from for multiple previous exams.  No evidence of pulmonary embolism.  Changes in the right pleural space consistent with the known history of prior pleurodesis. No sizable effusion is seen.  Minimal lingular infiltrate.   Electronically Signed   By: Inez Catalina M.D.   On: 05/28/2014 16:23   Dg Chest Portable 1 View  05/28/2014   CLINICAL DATA:  Mid sternal chest pain radiating to the left breast since this morning.  EXAM: PORTABLE CHEST - 1 VIEW  COMPARISON:  08/25/2012  FINDINGS: Right upper peritracheal mediastinal mass is stable consistent with a substernal enlarged thyroid.  Cardiac silhouette is mildly enlarged. Aorta is uncoiled. No hilar masses or evidence of adenopathy.  Lungs are hyperexpanded. No lung consolidation or edema. No pleural effusion or pneumothorax.  Bony thorax is demineralized but grossly intact.  IMPRESSION: No acute cardiopulmonary disease.   Electronically Signed   By: Lajean Manes M.D.   On: 05/28/2014 11:22    EKG:   Orders placed or performed during the hospital encounter of 05/28/14  . ED EKG (<38mins upon arrival to the ED)  . ED EKG (<62mins upon  arrival to the ED)    IMPRESSION AND PLAN:   Alexis Barry  is a 80 y.o. female with a known history of hypertension and atrial fibrillation on eliquis presents to the hospital secondary to chest pain that started this morning.  #1 chest pain-atypical chest pain. Pleuritic in nature. We'll admit under observation. Monitor cardiac enzymes. Pain medication as needed. CT of the chest is negative for any pulmonary embolism. #2 atrial fibrillation with rapid ventricular response-no indication for Cardizem drip at this time. Continue her by mouth diltiazem and metoprolol. Continue anticoagulation with eliquis. #3 hypertension-continue Cardizem and metoprolol.  #4 hypokalemia-replace and recheck in the morning and also check magnesium. #5 DVT prophylaxis-continue eliquis.    All the records are reviewed and case discussed with ED provider. Management plans discussed with the patient, family and they are in agreement.  CODE STATUS: Full code  TOTAL TIME TAKING CARE OF THIS PATIENT: 50 minutes.    Gladstone Lighter M.D on 05/28/2014 at 6:43 PM  Between 7am to 6pm - Pager - [phone]  After 6pm go to [email or web address] - password [email or web address] [facility]  [place] [facility]  Office  [phone]  CC: Primary care physician; No primary care provider on file.

## 2014-05-29 DIAGNOSIS — I10 Essential (primary) hypertension: Secondary | ICD-10-CM | POA: Diagnosis not present

## 2014-05-29 DIAGNOSIS — E876 Hypokalemia: Secondary | ICD-10-CM | POA: Diagnosis not present

## 2014-05-29 DIAGNOSIS — I481 Persistent atrial fibrillation: Secondary | ICD-10-CM | POA: Diagnosis not present

## 2014-05-29 DIAGNOSIS — R079 Chest pain, unspecified: Secondary | ICD-10-CM | POA: Diagnosis not present

## 2014-05-29 LAB — BASIC METABOLIC PANEL
Anion gap: 8 (ref 5–15)
BUN: 11 mg/dL (ref 6–20)
CO2: 26 mmol/L (ref 22–32)
Calcium: 8.6 mg/dL — ABNORMAL LOW (ref 8.9–10.3)
Chloride: 98 mmol/L — ABNORMAL LOW (ref 101–111)
Creatinine, Ser: 0.66 mg/dL (ref 0.44–1.00)
GFR calc Af Amer: >60 mL/min (ref >60)
Glucose, Bld: 138 mg/dL — ABNORMAL HIGH (ref 65–99)
POTASSIUM: 3.8 mmol/L (ref 3.5–5.1)
Sodium: 132 mmol/L — ABNORMAL LOW (ref 135–145)

## 2014-05-29 LAB — TROPONIN I
Troponin I: 0.03 ng/mL (ref <0.031)
Troponin I: <0.03 ng/mL (ref <0.031)

## 2014-05-29 LAB — CK TOTAL AND CKMB (NOT AT ARMC)
CK TOTAL: 35 U/L — AB (ref 38–234)
CK, MB: 1.7 ng/mL (ref 0.5–5.0)
CK, MB: 0.8 ng/mL (ref 0.5–5.0)
RELATIVE INDEX: INVALID (ref 0.0–2.5)
Relative Index: INVALID (ref 0.0–2.5)
Total CK: 34 U/L — ABNORMAL LOW (ref 38–234)

## 2014-05-29 LAB — CBC
HCT: 38.2 % (ref 35.0–47.0)
Hemoglobin: 13.1 g/dL (ref 12.0–16.0)
MCH: 31.4 pg (ref 26.0–34.0)
MCHC: 34.2 g/dL (ref 32.0–36.0)
MCV: 91.7 fL (ref 80.0–100.0)
PLATELETS: 151 10*3/uL (ref 150–440)
RBC: 4.16 MIL/uL (ref 3.80–5.20)
RDW: 13.8 % (ref 11.5–14.5)
WBC: 9.6 10*3/uL (ref 3.6–11.0)

## 2014-05-29 MED ORDER — DIPHENHYDRAMINE HCL 25 MG PO CAPS: 25.0000 mg | ORAL_CAPSULE | Freq: Every evening | ORAL | Status: DC | PRN

## 2014-05-29 MED ORDER — DIPHENHYDRAMINE HCL 25 MG PO CAPS
25.0000 mg | ORAL_CAPSULE | Freq: Once | ORAL | Status: AC
  Administered 2014-05-29: 25 mg via ORAL
  Filled 2014-05-29: qty 1

## 2014-05-29 NOTE — Progress Notes (Signed)
Patient is alert and oriented  this shift. Denied any chest pain. No respiratory distress noted. Still A fib at a rate of 80 on the cardiac monitor. Resting quietly at this time  after the Benadryl administration, respirations even and unlabored. Troponins negative.

## 2014-05-29 NOTE — Discharge Summary (Signed)
[place] at [place] NAME: Alexis Barry    MR#:  [id number]  DATE OF BIRTH:  [birth year]  DATE OF ADMISSION:  05/28/2014 ADMITTING PHYSICIAN: Gladstone Lighter, MD  DATE OF DISCHARGE: 05/29/2014 11:24 AM  PRIMARY CARE PHYSICIAN: Leta Baptist    ADMISSION DIAGNOSIS:  Atrial fibrillation with rapid ventricular response [I48.91]  DISCHARGE DIAGNOSIS:  Principal Problem:   Chest pain   SECONDARY DIAGNOSIS:   Past Medical History  Diagnosis Date  . Atrial fibrillation   . Hypertension   . Cancer     Left breast cancer s/p left mastectomy, chemo and radiation    HOSPITAL COURSE:   This is a 80 year old female with history of hypertension and atrial fibrillation Eliquis who presented to the hospital secondary to chest pain that started that morning. For further details please further H&P.  1. Chest pain: Patient's chest pain sounded atypical in nature. Her she was admitted to telemetry unit and her troponin levels were checked 3. Her troponins were negative. Telemetry did not show any acute events. Her EKG did not show ST elevation or depression. She was chest pain-free throughout her hospitalization. Her chest pain was pleuritic in nature she therefore underwent a CT chest in the emergency department which was negative pulmonary emboli. She will need a follow-up with her cardiologist next week.  2. Atrial fibrillation with RVR: Patient's heart rate improved with IV diltiazem which was given in the emergency department. Patient will continue outpatient medications including diltiazem and metoprolol. Patient will continue with anticoagulation with Eliquis. Exline 3.Essential hypertension: Patient will continue Cardizem and metoprolol. Her blood pressure was controlled during this auscultation.  4.Hypokalemia: Patient's potassium level was repleted.    DISCHARGE CONDITIONS AND DIET:  Stable low sodium heart healthy   CONSULTS  OBTAINED:    None  DRUG ALLERGIES:  No Known Allergies  DISCHARGE MEDICATIONS:   Discharge Medication List as of 05/29/2014 10:24 AM    CONTINUE these medications which have NOT CHANGED   Details  apixaban (ELIQUIS) 5 MG TABS tablet Take 5 mg by mouth 2 (two) times daily., Until Discontinued, Historical Med    diltiazem (CARDIZEM CD) 180 MG 24 hr capsule Take 180 mg by mouth daily., Until Discontinued, Historical Med    furosemide (LASIX) 20 MG tablet Take 40 mg by mouth every morning., Until Discontinued, Historical Med    letrozole (FEMARA) 2.5 MG tablet Take 2.5 mg by mouth daily., Until Discontinued, Historical Med    metoprolol (LOPRESSOR) 50 MG tablet Take 50 mg by mouth 2 (two) times daily., Until Discontinued, Historical Med              Today   CHIEF COMPLAINT:  Patient denies any chest pain. Her chest pain resolved prior to coming to the emergency department.   VITAL SIGNS:  Blood pressure 123/68, pulse 95, temperature 98.1 F (36.7 C), temperature source Oral, resp. rate 17, height 5\' 7"  (1.702 m), weight 78.109 kg (172 lb 3.2 oz), SpO2 97 %.   REVIEW OF SYSTEMS:  Review of Systems  Constitutional: Negative for fever and chills.  Eyes: Negative for blurred vision.  Respiratory: Negative for cough.   Cardiovascular: Negative for chest pain and palpitations.  Gastrointestinal: Negative for heartburn, nausea and vomiting.  Genitourinary: Negative for dysuria.  Musculoskeletal: Negative for myalgias.  Neurological: Negative for tremors and headaches.  All other systems reviewed and are negative.    PHYSICAL EXAMINATION:  GENERAL:  80 y.o.-year-old patient lying  in the bed with no acute distress.  NECK:  Supple, no jugular venous distention. No thyroid enlargement, no tenderness.  LUNGS: Normal breath sounds bilaterally, no wheezing, rales,rhonchi  No use of accessory muscles of respiration.  CARDIOVASCULAR: S1, S2 normal. No murmurs, rubs, or gallops.   ABDOMEN: Soft, non-tender, non-distended. Bowel sounds present. No organomegaly or mass.  EXTREMITIES: No pedal edema, cyanosis, or clubbing.  PSYCHIATRIC: The patient is alert and oriented x 3.  SKIN: No obvious rash, lesion, or ulcer.   DATA REVIEW:   CBC  Recent Labs Lab 05/29/14 0213  WBC 9.6  HGB 13.1  HCT 38.2  PLT 151    Chemistries   Recent Labs Lab 05/28/14 1432  05/29/14 0212  NA  --   --  132*  K  --   --  3.8  CL  --   --  98*  CO2  --   --  26  GLUCOSE  --   --  138*  BUN  --   --  11  CREATININE  --   < > 0.66  CALCIUM  --   --  8.6*  MG 1.8  --   --   < > = values in this interval not displayed.  Cardiac Enzymes  Recent Labs Lab 05/28/14 2159 05/29/14 0212 05/29/14 0827  TROPONINI <0.03 0.03 <0.03    Microbiology Results  Results for orders placed or performed in visit on 12/08/11  Body fluid culture     Status: None   Collection Time: 12/08/11  7:34 PM  Result Value Ref Range Status   Micro Text Report   Final       SOURCE: PLEURAL FLUID    COMMENT                   NO GROWTH AEROBICALLY/ANAEROBICALLY IN 4 DAYS   GRAM STAIN                GROSSLY BLOODY   GRAM STAIN                MANY WHITE BLOOD CELLS SEEN   GRAM STAIN                NO ORGANISMS SEEN   ANTIBIOTIC                                                        RADIOLOGY:  Ct Angio Chest Pe W/cm &/or Wo Cm  05/28/2014    IMPRESSION: Chronic intrathoracic goiter stable from for multiple previous exams.  No evidence of pulmonary embolism.  Changes in the right pleural space consistent with the known history of prior pleurodesis. No sizable effusion is seen.  Minimal lingular infiltrate.   Electronically Signed   By: Inez Catalina M.D.   On: 05/28/2014 16:23   Dg Chest Portable 1 View  05/28/2014    IMPRESSION: No acute cardiopulmonary disease.   Electronically Signed   By: Lajean Manes M.D.   On: 05/28/2014 11:22      Management plans discussed with the patient and she  is in agreement. Stable for discharge home.   She will need to follow-up with her cardiologist early next week. CODE STATUS:     Code Status Orders        Start  Ordered   05/28/14 2034  Full code   Continuous     05/28/14 2033      TOTAL TIME TAKING CARE OF THIS PATIENT: 35 minutes.    Arval Brandstetter M.D on 05/29/2014 at 11:57 AM  Between 7am to 6pm - Pager - [phone] After 6pm go to [email or web address] - password [email or web address] [facility]  [place] [facility]  Office  [phone]  CC: Primary care physician; No primary care provider on file.

## 2014-05-29 NOTE — Progress Notes (Signed)
Pt is a&o, Afib on tele, VSS with no complaints of pain or discomfort. IV and tele removed, discharge instructions given with verbal acknowledgment of understanding by pt and daughter. No prescriptions and no additional questions. Pt to be escorted off unit via wheelchair by nursing.

## 2014-06-03 DIAGNOSIS — I10 Essential (primary) hypertension: Secondary | ICD-10-CM | POA: Insufficient documentation

## 2014-06-09 DIAGNOSIS — I5022 Chronic systolic (congestive) heart failure: Secondary | ICD-10-CM | POA: Diagnosis not present

## 2014-06-09 DIAGNOSIS — I482 Chronic atrial fibrillation: Secondary | ICD-10-CM | POA: Diagnosis not present

## 2014-06-09 DIAGNOSIS — R0781 Pleurodynia: Secondary | ICD-10-CM | POA: Diagnosis not present

## 2014-06-09 DIAGNOSIS — I10 Essential (primary) hypertension: Secondary | ICD-10-CM | POA: Diagnosis not present

## 2014-06-10 ENCOUNTER — Other Ambulatory Visit: Payer: Self-pay | Admitting: Oncology

## 2014-06-28 ENCOUNTER — Other Ambulatory Visit: Payer: Self-pay

## 2014-06-28 MED ORDER — LETROZOLE 2.5 MG PO TABS: 2.5000 mg | ORAL_TABLET | Freq: Every day | ORAL | Status: DC

## 2014-07-18 ENCOUNTER — Other Ambulatory Visit: Payer: Self-pay | Admitting: *Deleted

## 2014-07-18 DIAGNOSIS — C50919 Malignant neoplasm of unspecified site of unspecified female breast: Secondary | ICD-10-CM

## 2014-07-22 ENCOUNTER — Inpatient Hospital Stay (HOSPITAL_BASED_OUTPATIENT_CLINIC_OR_DEPARTMENT_OTHER): Payer: Medicare Other | Admitting: Oncology

## 2014-07-22 ENCOUNTER — Inpatient Hospital Stay: Payer: Medicare Other | Attending: Oncology

## 2014-07-22 ENCOUNTER — Encounter: Payer: Self-pay | Admitting: Oncology

## 2014-07-22 VITALS — BP 113/60 | HR 77 | Temp 96.6°F | Resp 18 | Wt 173.3 lb

## 2014-07-22 DIAGNOSIS — I4891 Unspecified atrial fibrillation: Secondary | ICD-10-CM | POA: Insufficient documentation

## 2014-07-22 DIAGNOSIS — E041 Nontoxic single thyroid nodule: Secondary | ICD-10-CM | POA: Diagnosis not present

## 2014-07-22 DIAGNOSIS — I509 Heart failure, unspecified: Secondary | ICD-10-CM

## 2014-07-22 DIAGNOSIS — C50912 Malignant neoplasm of unspecified site of left female breast: Secondary | ICD-10-CM | POA: Insufficient documentation

## 2014-07-22 DIAGNOSIS — G629 Polyneuropathy, unspecified: Secondary | ICD-10-CM

## 2014-07-22 DIAGNOSIS — Z170 Estrogen receptor positive status [ER+]: Secondary | ICD-10-CM

## 2014-07-22 DIAGNOSIS — Z79811 Long term (current) use of aromatase inhibitors: Secondary | ICD-10-CM

## 2014-07-22 DIAGNOSIS — R600 Localized edema: Secondary | ICD-10-CM

## 2014-07-22 DIAGNOSIS — C50919 Malignant neoplasm of unspecified site of unspecified female breast: Secondary | ICD-10-CM

## 2014-07-22 DIAGNOSIS — M810 Age-related osteoporosis without current pathological fracture: Secondary | ICD-10-CM | POA: Diagnosis not present

## 2014-07-22 DIAGNOSIS — I10 Essential (primary) hypertension: Secondary | ICD-10-CM | POA: Insufficient documentation

## 2014-07-23 LAB — CANCER ANTIGEN 27.29: CA 27.29: 24.5 U/mL (ref 0.0–38.6)

## 2014-08-02 DIAGNOSIS — C50512 Malignant neoplasm of lower-outer quadrant of left female breast: Secondary | ICD-10-CM | POA: Insufficient documentation

## 2014-08-02 NOTE — Progress Notes (Signed)
[place]  Telephone:(336) [phone] [phone]) [phone]  ID: Alexis Barry OB: Apr 15, 1934  MR#: [id number]  QXI#:[id number]  No care team member to display  CHIEF COMPLAINT:  Chief Complaint  Patient presents with  . Follow-up    breast cancer    INTERVAL HISTORY: Patient returns to clinic today for routine 6 month evaluation.  She continues to feel well and is asymptomatic.  She is tolerating letrozole and Fosamax without significant side effects.  She does not complain of peripheral neuropathy today.  She does not complain of chest pain or shortness of breath. She denies any recent fevers.  She denies any nausea, vomiting, constipation, or diarrhea.  Patient offers no specific complaints today.  REVIEW OF SYSTEMS:   Review of Systems  Constitutional: Negative.   Respiratory: Negative.   Cardiovascular: Negative.     As per HPI. Otherwise, a complete review of systems is negatve.  PAST MEDICAL HISTORY: Past Medical History  Diagnosis Date  . Atrial fibrillation   . Hypertension   . Cancer     Left breast cancer s/p left mastectomy, chemo and radiation  . CHF (congestive heart failure)   . Goiter   . Mild dementia     PAST SURGICAL HISTORY: Past Surgical History  Procedure Laterality Date  . Breast surgery      Left mastectomy  . Thyroid surgery  1977    FAMILY HISTORY Family History  Problem Relation Age of Onset  . CAD Father        ADVANCED DIRECTIVES:    HEALTH MAINTENANCE: History  Substance Use Topics  . Smoking status: Never Smoker   . Smokeless tobacco: Never Used  . Alcohol Use: No     Colonoscopy:  PAP:  Bone density:  Lipid panel:  No Known Allergies  Current Outpatient Prescriptions  Medication Sig Dispense Refill  . alendronate (FOSAMAX) 70 MG tablet TAKE 1 TABLET BY MOUTH ONCE A WEEK FOR BONE HEALTH 4 tablet 3  . apixaban (ELIQUIS) 5 MG TABS tablet Take 5 mg by mouth 2 (two) times daily.    Marland Kitchen diltiazem  (CARDIZEM CD) 180 MG 24 hr capsule Take 180 mg by mouth daily.    . furosemide (LASIX) 20 MG tablet Take 40 mg by mouth every morning.    Marland Kitchen letrozole (FEMARA) 2.5 MG tablet Take 1 tablet (2.5 mg total) by mouth daily. 30 tablet 1  . metoprolol (LOPRESSOR) 50 MG tablet Take 50 mg by mouth 2 (two) times daily.     No current facility-administered medications for this visit.    OBJECTIVE: Filed Vitals:   07/22/14 1222  BP: 113/60  Pulse: 77  Temp: 96.6 F (35.9 C)  Resp: 18     Body mass index is 27.13 kg/(m^2).    ECOG FS:0 - Asymptomatic  General: Well-developed, well-nourished, no acute distress. Eyes: anicteric sclera. Breasts: Left mastectomy, patient requested exam be deferred today. Lungs: Clear to auscultation bilaterally. Heart: Regular rate and rhythm. No rubs, murmurs, or gallops. Abdomen: Soft, nontender, nondistended. No organomegaly noted, normoactive bowel sounds. Musculoskeletal: No edema, cyanosis, or clubbing. Neuro: Alert, answering all questions appropriately. Cranial nerves grossly intact. Skin: No rashes or petechiae noted. Psych: Normal affect.  LAB RESULTS:  Lab Results  Component Value Date   NA 132* 05/29/2014   K 3.8 05/29/2014   CL 98* 05/29/2014   CO2 26 05/29/2014   GLUCOSE 138* 05/29/2014   BUN 11 05/29/2014   CREATININE 0.66 05/29/2014   CALCIUM 8.6*  05/29/2014   PROT 6.5 10/01/2012   ALBUMIN 3.6 10/01/2012   AST 17 10/01/2012   ALT 12 10/01/2012   ALKPHOS 94 10/01/2012   GFRNONAA >60 05/29/2014   GFRAA >60 05/29/2014    Lab Results  Component Value Date   WBC 9.6 05/29/2014   NEUTROABS 3.3 12/11/2012   HGB 13.1 05/29/2014   HCT 38.2 05/29/2014   MCV 91.7 05/29/2014   PLT 151 05/29/2014     STUDIES: No results found.  ASSESSMENT: Pathologic stage IIIb ER/PR positive, HER-2 negative breast cancer  PLAN:    1.  Breast cancer: Patient completed neoadjuvant chemotherapy, left mastectomy, and adjuvant XRT.  Given the ER/PR  status of her tumor, she will take letrozole for a total of 5 years completing in December 2019.  Her most recent mammogram on January 12, 2014 was reported as BI-RADS 1, repeat in December 2016. Return to clinic in 6 months for routine evaluation.  2. Osteoporosis: Bone mineral density on January 12, 2014 revealed a T score of -3.4 which is improved over 1 year prior.  Continue Fosamax, calcium, and vitamin D. Repeat in December 2016.  3.  Thyroid nodule: Unclear etiology.  Continue to monitor and if nodule increases in size we will refer her to ENT for biopsy.  4.  Peripheral edema: Continue Lasix as needed. 5.  Peripheral neuropathy: Continue B complex vitamin.  Patient expressed understanding and was in agreement with this plan. She also understands that She can call clinic at any time with any questions, concerns, or complaints.   Breast cancer   Staging form: Breast, AJCC 7th Edition     Clinical stage from 08/02/2014: Stage IIIB (T4, N0, M0) - Signed by Lloyd Huger, MD on 08/02/2014   Lloyd Huger, MD   08/02/2014 5:24 PM

## 2014-09-07 DIAGNOSIS — E781 Pure hyperglyceridemia: Secondary | ICD-10-CM | POA: Diagnosis not present

## 2014-09-19 DIAGNOSIS — Z0001 Encounter for general adult medical examination with abnormal findings: Secondary | ICD-10-CM | POA: Diagnosis not present

## 2014-09-19 DIAGNOSIS — R7301 Impaired fasting glucose: Secondary | ICD-10-CM | POA: Diagnosis not present

## 2014-09-19 DIAGNOSIS — C50912 Malignant neoplasm of unspecified site of left female breast: Secondary | ICD-10-CM | POA: Diagnosis not present

## 2014-09-19 DIAGNOSIS — Z0000 Encounter for general adult medical examination without abnormal findings: Secondary | ICD-10-CM | POA: Diagnosis not present

## 2014-09-19 DIAGNOSIS — E782 Mixed hyperlipidemia: Secondary | ICD-10-CM | POA: Diagnosis not present

## 2014-09-19 DIAGNOSIS — I482 Chronic atrial fibrillation: Secondary | ICD-10-CM | POA: Diagnosis not present

## 2014-09-19 DIAGNOSIS — I10 Essential (primary) hypertension: Secondary | ICD-10-CM | POA: Diagnosis not present

## 2014-10-04 ENCOUNTER — Other Ambulatory Visit: Payer: Self-pay

## 2014-10-04 MED ORDER — ALENDRONATE SODIUM 70 MG PO TABS: 70.0000 mg | ORAL_TABLET | ORAL | Status: DC

## 2014-10-20 DIAGNOSIS — I5022 Chronic systolic (congestive) heart failure: Secondary | ICD-10-CM | POA: Diagnosis not present

## 2014-10-20 DIAGNOSIS — I340 Nonrheumatic mitral (valve) insufficiency: Secondary | ICD-10-CM | POA: Diagnosis not present

## 2014-10-20 DIAGNOSIS — R0602 Shortness of breath: Secondary | ICD-10-CM | POA: Diagnosis not present

## 2014-10-20 DIAGNOSIS — I482 Chronic atrial fibrillation: Secondary | ICD-10-CM | POA: Diagnosis not present

## 2014-11-17 DIAGNOSIS — I340 Nonrheumatic mitral (valve) insufficiency: Secondary | ICD-10-CM | POA: Diagnosis not present

## 2014-11-17 DIAGNOSIS — I482 Chronic atrial fibrillation: Secondary | ICD-10-CM | POA: Diagnosis not present

## 2014-11-17 DIAGNOSIS — R0602 Shortness of breath: Secondary | ICD-10-CM | POA: Diagnosis not present

## 2015-01-12 DIAGNOSIS — I071 Rheumatic tricuspid insufficiency: Secondary | ICD-10-CM | POA: Insufficient documentation

## 2015-01-12 DIAGNOSIS — I10 Essential (primary) hypertension: Secondary | ICD-10-CM | POA: Diagnosis not present

## 2015-01-12 DIAGNOSIS — R2681 Unsteadiness on feet: Secondary | ICD-10-CM | POA: Diagnosis not present

## 2015-01-12 DIAGNOSIS — I482 Chronic atrial fibrillation: Secondary | ICD-10-CM | POA: Diagnosis not present

## 2015-01-19 ENCOUNTER — Ambulatory Visit
Admission: RE | Admit: 2015-01-19 | Discharge: 2015-01-19 | Disposition: A | Discharge status: Home / Self Care | Payer: Medicare Other | Source: Ambulatory Visit | Attending: Physician Assistant | Admitting: Physician Assistant

## 2015-01-19 ENCOUNTER — Other Ambulatory Visit: Payer: Self-pay | Admitting: Physician Assistant

## 2015-01-19 DIAGNOSIS — R05 Cough: Secondary | ICD-10-CM | POA: Insufficient documentation

## 2015-01-19 DIAGNOSIS — R059 Cough, unspecified: Secondary | ICD-10-CM

## 2015-01-19 DIAGNOSIS — R509 Fever, unspecified: Secondary | ICD-10-CM | POA: Diagnosis not present

## 2015-01-19 DIAGNOSIS — E049 Nontoxic goiter, unspecified: Secondary | ICD-10-CM | POA: Insufficient documentation

## 2015-02-03 ENCOUNTER — Inpatient Hospital Stay: Payer: Medicare Other

## 2015-02-03 ENCOUNTER — Inpatient Hospital Stay: Payer: Medicare Other | Admitting: Oncology

## 2015-02-27 ENCOUNTER — Other Ambulatory Visit: Payer: Self-pay

## 2015-02-27 MED ORDER — LETROZOLE 2.5 MG PO TABS: 2.5000 mg | ORAL_TABLET | Freq: Every day | ORAL | Status: DC

## 2015-03-09 ENCOUNTER — Other Ambulatory Visit: Payer: Self-pay | Admitting: *Deleted

## 2015-03-09 DIAGNOSIS — C50912 Malignant neoplasm of unspecified site of left female breast: Secondary | ICD-10-CM

## 2015-03-10 ENCOUNTER — Inpatient Hospital Stay: Payer: Medicare Other | Attending: Oncology

## 2015-03-10 ENCOUNTER — Inpatient Hospital Stay (HOSPITAL_BASED_OUTPATIENT_CLINIC_OR_DEPARTMENT_OTHER): Payer: Medicare Other | Admitting: Oncology

## 2015-03-10 VITALS — BP 141/78 | HR 88 | Temp 95.8°F | Resp 20 | Wt 169.1 lb

## 2015-03-10 DIAGNOSIS — Z9221 Personal history of antineoplastic chemotherapy: Secondary | ICD-10-CM | POA: Insufficient documentation

## 2015-03-10 DIAGNOSIS — I4891 Unspecified atrial fibrillation: Secondary | ICD-10-CM | POA: Diagnosis not present

## 2015-03-10 DIAGNOSIS — E041 Nontoxic single thyroid nodule: Secondary | ICD-10-CM | POA: Insufficient documentation

## 2015-03-10 DIAGNOSIS — Z923 Personal history of irradiation: Secondary | ICD-10-CM | POA: Insufficient documentation

## 2015-03-10 DIAGNOSIS — I10 Essential (primary) hypertension: Secondary | ICD-10-CM | POA: Insufficient documentation

## 2015-03-10 DIAGNOSIS — C50912 Malignant neoplasm of unspecified site of left female breast: Secondary | ICD-10-CM

## 2015-03-10 DIAGNOSIS — F0390 Unspecified dementia without behavioral disturbance: Secondary | ICD-10-CM | POA: Diagnosis not present

## 2015-03-10 DIAGNOSIS — I509 Heart failure, unspecified: Secondary | ICD-10-CM | POA: Diagnosis not present

## 2015-03-10 DIAGNOSIS — G629 Polyneuropathy, unspecified: Secondary | ICD-10-CM | POA: Diagnosis not present

## 2015-03-10 DIAGNOSIS — M810 Age-related osteoporosis without current pathological fracture: Secondary | ICD-10-CM | POA: Diagnosis not present

## 2015-03-10 DIAGNOSIS — Z79899 Other long term (current) drug therapy: Secondary | ICD-10-CM | POA: Diagnosis not present

## 2015-03-10 DIAGNOSIS — Z9012 Acquired absence of left breast and nipple: Secondary | ICD-10-CM | POA: Diagnosis not present

## 2015-03-10 DIAGNOSIS — Z170 Estrogen receptor positive status [ER+]: Secondary | ICD-10-CM | POA: Insufficient documentation

## 2015-03-10 DIAGNOSIS — Z79811 Long term (current) use of aromatase inhibitors: Secondary | ICD-10-CM

## 2015-03-10 DIAGNOSIS — Z7901 Long term (current) use of anticoagulants: Secondary | ICD-10-CM | POA: Diagnosis not present

## 2015-03-10 NOTE — Progress Notes (Signed)
Pt here for ongoing eval of Breast Cancer. Pt is taking letrozole daily. Has residual neuropathy in fingers and toes. Pt fell getting into car this am. She bruised her R hand with small abrasion. No swelling. Good movement of hand and fingers. Pt said her mammogram was never scheduled.

## 2015-03-10 NOTE — Progress Notes (Signed)
[place]  Telephone:(336) [phone] [phone]) [phone]  ID: Clabe Seal OB: Nov 01, 1934  MR#: [id number]  AOZ#:[id number]  Patient Care Team: Christie Nottingham, PA as PCP - General (Physician Assistant)  CHIEF COMPLAINT: Breast cancer follow-up. No chief complaint on file.   INTERVAL HISTORY: Patient returns to clinic today for routine 6 month evaluation.  She continues to feel well and is asymptomatic.  She is tolerating letrozole and Fosamax without significant side effects.  She does not complain of peripheral neuropathy today.  She does not complain of chest pain or shortness of breath. She denies any recent fevers.  She denies any nausea, vomiting, constipation, or diarrhea.  Patient offers no specific complaints today.  REVIEW OF SYSTEMS:   Review of Systems  Constitutional: Negative.   Respiratory: Negative.   Cardiovascular: Negative.     As per HPI. Otherwise, a complete review of systems is negatve.  PAST MEDICAL HISTORY: Past Medical History  Diagnosis Date  . Atrial fibrillation   . Hypertension   . Cancer     Left breast cancer s/p left mastectomy, chemo and radiation  . CHF (congestive heart failure)   . Goiter   . Mild dementia     PAST SURGICAL HISTORY: Past Surgical History  Procedure Laterality Date  . Breast surgery      Left mastectomy  . Thyroid surgery  1977    FAMILY HISTORY Family History  Problem Relation Age of Onset  . CAD Father        ADVANCED DIRECTIVES:    HEALTH MAINTENANCE: Social History  Substance Use Topics  . Smoking status: Never Smoker   . Smokeless tobacco: Never Used  . Alcohol Use: No     Colonoscopy:  PAP:  Bone density:  Lipid panel:  No Known Allergies  Current Outpatient Prescriptions  Medication Sig Dispense Refill  . alendronate (FOSAMAX) 70 MG tablet Take 1 tablet (70 mg total) by mouth once a week. Take with a full glass of water on an empty stomach. 4 tablet 5  . apixaban  (ELIQUIS) 5 MG TABS tablet Take 5 mg by mouth 2 (two) times daily.    Marland Kitchen diltiazem (CARDIZEM CD) 180 MG 24 hr capsule Take 180 mg by mouth daily.    . furosemide (LASIX) 20 MG tablet Take 40 mg by mouth every morning.    Marland Kitchen letrozole (FEMARA) 2.5 MG tablet Take 1 tablet (2.5 mg total) by mouth daily. 30 tablet 0  . metoprolol (LOPRESSOR) 50 MG tablet Take 50 mg by mouth 2 (two) times daily.     No current facility-administered medications for this visit.    OBJECTIVE: Filed Vitals:   03/10/15 0958  BP: 141/78  Pulse: 88  Temp: 95.8 F (35.4 C)  Resp: 20     Body mass index is 26.48 kg/(m^2).    ECOG FS:0 - Asymptomatic  General: Well-developed, well-nourished, no acute distress. Eyes: anicteric sclera. Breasts: Left mastectomy, patient requested exam be deferred today. Lungs: Clear to auscultation bilaterally. Heart: Regular rate and rhythm. No rubs, murmurs, or gallops. Abdomen: Soft, nontender, nondistended. No organomegaly noted, normoactive bowel sounds. Musculoskeletal: No edema, cyanosis, or clubbing. Neuro: Alert, answering all questions appropriately. Cranial nerves grossly intact. Skin: No rashes or petechiae noted. Psych: Normal affect.  LAB RESULTS:  Lab Results  Component Value Date   NA 132* 05/29/2014   K 3.8 05/29/2014   CL 98* 05/29/2014   CO2 26 05/29/2014   GLUCOSE 138* 05/29/2014   BUN  11 05/29/2014   CREATININE 0.66 05/29/2014   CALCIUM 8.6* 05/29/2014   PROT 6.5 10/01/2012   ALBUMIN 3.6 10/01/2012   AST 17 10/01/2012   ALT 12 10/01/2012   ALKPHOS 94 10/01/2012   BILITOT 0.3 10/01/2012   GFRNONAA >60 05/29/2014   GFRAA >60 05/29/2014    Lab Results  Component Value Date   WBC 9.6 05/29/2014   NEUTROABS 3.3 12/11/2012   HGB 13.1 05/29/2014   HCT 38.2 05/29/2014   MCV 91.7 05/29/2014   PLT 151 05/29/2014   Lab Results  Component Value Date   LABCA2 26.3 03/10/2015     STUDIES: No results found.  ASSESSMENT: Pathologic stage IIIb  ER/PR positive, HER-2 negative breast cancer  PLAN:    1.  Breast cancer: Patient completed neoadjuvant chemotherapy, left mastectomy, and adjuvant XRT.  Given the ER/PR status of her tumor, she will take letrozole for a total of 5 years completing in December 2019. CA-27-29 continues to be within normal limits. Her most recent mammogram on January 12, 2014 was reported as BI-RADS 1. Return to clinic in 6 months for routine evaluation. Patient did not have mammogram in December 2016, it will be scheduled today to be completed in the next 1-2 weeks. 2. Osteoporosis: Bone mineral density on January 12, 2014 revealed a T score of -3.4 which is improved over 1 year prior.  Continue Fosamax, calcium, and vitamin D. Patient did not get her bone density in December 2016, it will be scheduled today to be completed in the next 1-2 weeks.  3.  Thyroid nodule: Unclear etiology.  Continue to monitor and if nodule increases in size we will refer her to ENT for biopsy.  4.  Peripheral edema: Resolved. Continue Lasix as needed. 5.  Peripheral neuropathy: Resolved. Continue B complex vitamin.  Patient expressed understanding and was in agreement with this plan. She also understands that She can call clinic at any time with any questions, concerns, or complaints.   Breast cancer   Staging form: Breast, AJCC 7th Edition     Clinical stage from 08/02/2014: Stage IIIB (T4, N0, M0) - Signed by Lloyd Huger, MD on 08/02/2014   Mayra Reel, NP   03/10/2015 10:11 AM  Patient seen and evaluated independently and I agree with the assessment and plan as dictated above.  Lloyd Huger, MD 03/12/2015 9:03 AM

## 2015-03-11 LAB — CANCER ANTIGEN 27.29: CA 27.29: 26.3 U/mL (ref 0.0–38.6)

## 2015-03-18 ENCOUNTER — Ambulatory Visit
Admission: EM | Admit: 2015-03-18 | Discharge: 2015-03-18 | Disposition: A | Discharge status: Home / Self Care | Payer: Medicare Other | Attending: Family Medicine | Admitting: Family Medicine

## 2015-03-18 ENCOUNTER — Encounter: Payer: Self-pay | Admitting: *Deleted

## 2015-03-18 DIAGNOSIS — N390 Urinary tract infection, site not specified: Secondary | ICD-10-CM

## 2015-03-18 LAB — URINALYSIS COMPLETE WITH MICROSCOPIC (ARMC ONLY)
BILIRUBIN URINE: NEGATIVE
GLUCOSE, UA: NEGATIVE mg/dL
Ketones, ur: NEGATIVE mg/dL
NITRITE: POSITIVE — AB
PH: 6 (ref 5.0–8.0)
SPECIFIC GRAVITY, URINE: 1.01 (ref 1.005–1.030)

## 2015-03-18 MED ORDER — CIPROFLOXACIN HCL 500 MG PO TABS: 500.0000 mg | ORAL_TABLET | Freq: Two times a day (BID) | ORAL | Status: AC

## 2015-03-18 NOTE — ED Notes (Signed)
Dysuria, urinary frequency and urgency x2 days.

## 2015-03-18 NOTE — Discharge Instructions (Signed)
Take medication as prescribed. Rest. Drink plenty of fluids.   Follow up with your primary care physician next week. Return to Urgent care for new or worsening concerns.    Urinary Tract Infection Urinary tract infections (UTIs) can develop anywhere along your urinary tract. Your urinary tract is your body's drainage system for removing wastes and extra water. Your urinary tract includes two kidneys, two ureters, a bladder, and a urethra. Your kidneys are a pair of bean-shaped organs. Each kidney is about the size of your fist. They are located below your ribs, one on each side of your spine. CAUSES Infections are caused by microbes, which are microscopic organisms, including fungi, viruses, and bacteria. These organisms are so small that they can only be seen through a microscope. Bacteria are the microbes that most commonly cause UTIs. SYMPTOMS  Symptoms of UTIs may vary by age and gender of the patient and by the location of the infection. Symptoms in young women typically include a frequent and intense urge to urinate and a painful, burning feeling in the bladder or urethra during urination. Older women and men are more likely to be tired, shaky, and weak and have muscle aches and abdominal pain. A fever may mean the infection is in your kidneys. Other symptoms of a kidney infection include pain in your back or sides below the ribs, nausea, and vomiting. DIAGNOSIS To diagnose a UTI, your caregiver will ask you about your symptoms. Your caregiver will also ask you to provide a urine sample. The urine sample will be tested for bacteria and white blood cells. White blood cells are made by your body to help fight infection. TREATMENT  Typically, UTIs can be treated with medication. Because most UTIs are caused by a bacterial infection, they usually can be treated with the use of antibiotics. The choice of antibiotic and length of treatment depend on your symptoms and the type of bacteria causing your  infection. HOME CARE INSTRUCTIONS  If you were prescribed antibiotics, take them exactly as your caregiver instructs you. Finish the medication even if you feel better after you have only taken some of the medication.  Drink enough water and fluids to keep your urine clear or pale yellow.  Avoid caffeine, tea, and carbonated beverages. They tend to irritate your bladder.  Empty your bladder often. Avoid holding urine for long periods of time.  Empty your bladder before and after sexual intercourse.  After a bowel movement, women should cleanse from front to back. Use each tissue only once. SEEK MEDICAL CARE IF:   You have back pain.  You develop a fever.  Your symptoms do not begin to resolve within 3 days. SEEK IMMEDIATE MEDICAL CARE IF:   You have severe back pain or lower abdominal pain.  You develop chills.  You have nausea or vomiting.  You have continued burning or discomfort with urination. MAKE SURE YOU:   Understand these instructions.  Will watch your condition.  Will get help right away if you are not doing well or get worse.   This information is not intended to replace advice given to you by your health care provider. Make sure you discuss any questions you have with your health care provider.   Document Released: 10/17/2004 Document Revised: 09/28/2014 Document Reviewed: 02/15/2011 Elsevier Interactive Patient Education [employer].

## 2015-03-18 NOTE — ED Provider Notes (Signed)
[facility]  ____________________________________________  Time seen: Approximately 9:16 AM  I have reviewed the triage vital signs and the nursing notes.   HISTORY  Chief Complaint Dysuria; Urinary Frequency; and Urinary Urgency   HPI Alexis Barry is a 81 y.o. female presents with daughter at bedside for the complaints of urinary frequency, urinary urgency for 2 days.Reports continues to eat and drink well. Denies fevers. Denies abdominal pain, back pain, flank pain, weakness. Denies pain with urination. Denies vaginal complaints. Reports has had a history of urinary tract infections but states she has not had a urinary tract infection several years. Denies recent antibiotic use. Denies other complaints.  Past Medical History  Diagnosis Date  . Atrial fibrillation ([place])   . Hypertension   . Cancer [facility])     Left breast cancer s/p left mastectomy, chemo and radiation  . CHF (congestive heart failure) ([place])   . Goiter   . Mild dementia     Patient Active Problem List   Diagnosis Date Noted  . Breast cancer ([place]) 08/02/2014  . Chest pain 05/28/2014    Past Surgical History  Procedure Laterality Date  . Breast surgery      Left mastectomy  . Thyroid surgery  1977    Current Outpatient Rx  Name  Route  Sig  Dispense  Refill  . alendronate (FOSAMAX) 70 MG tablet   Oral   Take 1 tablet (70 mg total) by mouth once a week. Take with a full glass of water on an empty stomach.   4 tablet   5   . apixaban (ELIQUIS) 5 MG TABS tablet   Oral   Take 5 mg by mouth 2 (two) times daily.         Marland Kitchen diltiazem (CARDIZEM CD) 180 MG 24 hr capsule   Oral   Take 180 mg by mouth daily.         . furosemide (LASIX) 20 MG tablet   Oral   Take 40 mg by mouth every morning.         Marland Kitchen letrozole (FEMARA) 2.5 MG tablet   Oral   Take 1 tablet (2.5 mg total) by mouth daily.   30 tablet   0     Needs to schedule appt   . metoprolol (LOPRESSOR) 50 MG tablet    Oral   Take 50 mg by mouth 2 (two) times daily.         .             Allergies Review of patient's allergies indicates no known allergies.  Family History  Problem Relation Age of Onset  . CAD Father     Social History Social History  Substance Use Topics  . Smoking status: Never Smoker   . Smokeless tobacco: Never Used  . Alcohol Use: No    Review of Systems Constitutional: No fever/chills Eyes: No visual changes. ENT: No sore throat. Cardiovascular: Denies chest pain. Respiratory: Denies shortness of breath. Gastrointestinal: No abdominal pain.  No nausea, no vomiting.  No diarrhea.  No constipation. Genitourinary: Positive for dysuria. Musculoskeletal: Negative for back pain. Skin: Negative for rash. Neurological: Negative for headaches, focal weakness or numbness.  10-point ROS otherwise negative.  ____________________________________________   PHYSICAL EXAM:  VITAL SIGNS: ED Triage Vitals  Enc Vitals Group     BP 03/18/15 0837 167/88 mmHg     Pulse Rate 03/18/15 0837 96     Resp 03/18/15 0837 16  Temp 03/18/15 0837 97.5 F (36.4 C)     Temp Source 03/18/15 0837 Oral     SpO2 03/18/15 0837 97 %     Weight 03/18/15 0837 169 lb (76.658 kg)     Height 03/18/15 0837 5\' 7"  (1.702 m)     Head Cir --      Peak Flow --      Pain Score 03/18/15 0914 1     Pain Loc --      Pain Edu? --      Excl. in [place]? --     Constitutional: Alert and oriented. Well appearing and in no acute distress. Eyes: Conjunctivae are normal. PERRL. EOMI. Head: Atraumatic.  Nose: No congestion/rhinnorhea.  Mouth/Throat: Mucous membranes are moist.   Neck: No stridor.  No cervical spine tenderness to palpation. Cardiovascular: Normal rate, regular rhythm. Grossly normal heart sounds.  Good peripheral circulation. Respiratory: Normal respiratory effort.  No retractions. Lungs CTAB. Gastrointestinal: Soft and nontender.  Normal Bowel sounds. No CVA tenderness. Musculoskeletal:  No lower or upper extremity tenderness nor edema. No cervical, thoracic or lumbar tenderness to palpation. Neurologic:  Normal speech and language. No gross focal neurologic deficits are appreciated. No gait instability. Skin:  Skin is warm, dry and intact. No rash noted. Psychiatric: Mood and affect are normal. Speech and behavior are normal.  ____________________________________________   LABS (all labs ordered are listed, but only abnormal results are displayed)  Labs Reviewed  URINALYSIS COMPLETEWITH MICROSCOPIC ([place]) - Abnormal; Notable for the following:    APPearance CLOUDY (*)    Hgb urine dipstick 1+ (*)    Protein, ur TRACE (*)    Nitrite POSITIVE (*)    Leukocytes, UA 3+ (*)    Bacteria, UA MANY (*)    Squamous Epithelial / LPF 0-5 (*)    All other components within normal limits  URINE CULTURE   INITIAL IMPRESSION / ASSESSMENT AND PLAN / ED COURSE  Pertinent labs & imaging results that were available during my care of the patient were reviewed by me and considered in my medical decision making (see chart for details).  Well-appearing patient. No acute distress. Daughter at bedside. Presents for the complaints of dysuria 2 days. Denies other complaints. Abdomen soft and nontender. No CVA tenderness. Lungs clear throughout. Urinalysis positive for many bacteria, 3+ leukocytes, nitrite positive, hemoglobin 1+, will culture urine. Will treat urinary tract infection with oral Cipro. Encourage rest, fluids and PCP follow up within 1 week.  Discussed follow up with Primary care physician this week. Discussed follow up and return parameters including no resolution or any worsening concerns. Patient verbalized understanding and agreed to plan.   ____________________________________________   FINAL CLINICAL IMPRESSION(S) / ED DIAGNOSES  Final diagnoses:  UTI (lower urinary tract infection)      Note: This dictation was prepared with Dragon dictation along with  smaller phrase technology. Any transcriptional errors that result from this process are unintentional.    Marylene Land, NP 03/18/15 0920  Marylene Land, NP 03/18/15 [phone]

## 2015-03-20 LAB — URINE CULTURE: Special Requests: NORMAL

## 2015-03-23 DIAGNOSIS — N390 Urinary tract infection, site not specified: Secondary | ICD-10-CM | POA: Diagnosis not present

## 2015-03-23 DIAGNOSIS — C50912 Malignant neoplasm of unspecified site of left female breast: Secondary | ICD-10-CM | POA: Diagnosis not present

## 2015-03-23 DIAGNOSIS — I482 Chronic atrial fibrillation: Secondary | ICD-10-CM | POA: Diagnosis not present

## 2015-03-27 ENCOUNTER — Ambulatory Visit: Payer: Medicare Other

## 2015-03-28 DIAGNOSIS — R350 Frequency of micturition: Secondary | ICD-10-CM | POA: Diagnosis not present

## 2015-03-29 ENCOUNTER — Ambulatory Visit: Payer: Medicare Other

## 2015-04-05 ENCOUNTER — Other Ambulatory Visit: Payer: Self-pay | Admitting: *Deleted

## 2015-04-05 MED ORDER — ALENDRONATE SODIUM 70 MG PO TABS: 70.0000 mg | ORAL_TABLET | ORAL | Status: DC

## 2015-04-12 ENCOUNTER — Ambulatory Visit
Admission: RE | Admit: 2015-04-12 | Discharge: 2015-04-12 | Disposition: A | Discharge status: Home / Self Care | Payer: Medicare Other | Source: Ambulatory Visit | Attending: Oncology | Admitting: Oncology

## 2015-04-12 DIAGNOSIS — C50912 Malignant neoplasm of unspecified site of left female breast: Secondary | ICD-10-CM

## 2015-04-12 DIAGNOSIS — Z1231 Encounter for screening mammogram for malignant neoplasm of breast: Secondary | ICD-10-CM | POA: Diagnosis not present

## 2015-04-12 HISTORY — DX: Malignant neoplasm of unspecified site of unspecified female breast: C50.919

## 2015-05-24 DIAGNOSIS — I482 Chronic atrial fibrillation: Secondary | ICD-10-CM | POA: Diagnosis not present

## 2015-05-24 DIAGNOSIS — F5102 Adjustment insomnia: Secondary | ICD-10-CM | POA: Diagnosis not present

## 2015-05-24 DIAGNOSIS — N390 Urinary tract infection, site not specified: Secondary | ICD-10-CM | POA: Diagnosis not present

## 2015-05-28 ENCOUNTER — Encounter: Payer: Self-pay | Admitting: *Deleted

## 2015-05-28 ENCOUNTER — Emergency Department
Admission: EM | Admit: 2015-05-28 | Discharge: 2015-05-28 | Disposition: A | Discharge status: Home / Self Care | Payer: Medicare Other | Attending: Emergency Medicine | Admitting: Emergency Medicine

## 2015-05-28 DIAGNOSIS — I509 Heart failure, unspecified: Secondary | ICD-10-CM | POA: Insufficient documentation

## 2015-05-28 DIAGNOSIS — R300 Dysuria: Secondary | ICD-10-CM | POA: Diagnosis present

## 2015-05-28 DIAGNOSIS — I110 Hypertensive heart disease with heart failure: Secondary | ICD-10-CM | POA: Insufficient documentation

## 2015-05-28 DIAGNOSIS — I4891 Unspecified atrial fibrillation: Secondary | ICD-10-CM | POA: Insufficient documentation

## 2015-05-28 DIAGNOSIS — Z79899 Other long term (current) drug therapy: Secondary | ICD-10-CM | POA: Insufficient documentation

## 2015-05-28 DIAGNOSIS — R110 Nausea: Secondary | ICD-10-CM | POA: Diagnosis not present

## 2015-05-28 LAB — URINALYSIS COMPLETE WITH MICROSCOPIC (ARMC ONLY)
BILIRUBIN URINE: NEGATIVE
GLUCOSE, UA: NEGATIVE mg/dL
HGB URINE DIPSTICK: NEGATIVE
Ketones, ur: NEGATIVE mg/dL
Nitrite: NEGATIVE
Protein, ur: NEGATIVE mg/dL
SPECIFIC GRAVITY, URINE: 1.011 (ref 1.005–1.030)
pH: 5 (ref 5.0–8.0)

## 2015-05-28 MED ORDER — ONDANSETRON HCL 4 MG PO TABS: 4.0000 mg | ORAL_TABLET | Freq: Every day | ORAL | Status: DC | PRN

## 2015-05-28 MED ORDER — ONDANSETRON 4 MG PO TBDP
4.0000 mg | ORAL_TABLET | Freq: Once | ORAL | Status: AC
  Administered 2015-05-28: 4 mg via ORAL
  Filled 2015-05-28: qty 1

## 2015-05-28 NOTE — Discharge Instructions (Signed)
Please seek medical attention for any high fevers, chest pain, shortness of breath, change in behavior, persistent vomiting, bloody stool or any other new or concerning symptoms.   Nausea, Adult Nausea is the feeling that you have an upset stomach or have to vomit. Nausea by itself is not likely a serious concern, but it may be an early sign of more serious medical problems. As nausea gets worse, it can lead to vomiting. If vomiting develops, there is the risk of dehydration.  CAUSES   Viral infections.  Food poisoning.  Medicines.  Pregnancy.  Motion sickness.  Migraine headaches.  Emotional distress.  Severe pain from any source.  Alcohol intoxication. HOME CARE INSTRUCTIONS  Get plenty of rest.  Ask your caregiver about specific rehydration instructions.  Eat small amounts of food and sip liquids more often.  Take all medicines as told by your caregiver. SEEK MEDICAL CARE IF:  You have not improved after 2 days, or you get worse.  You have a headache. SEEK IMMEDIATE MEDICAL CARE IF:   You have a fever.  You faint.  You keep vomiting or have blood in your vomit.  You are extremely weak or dehydrated.  You have dark or bloody stools.  You have severe chest or abdominal pain. MAKE SURE YOU:  Understand these instructions.  Will watch your condition.  Will get help right away if you are not doing well or get worse.   This information is not intended to replace advice given to you by your health care provider. Make sure you discuss any questions you have with your health care provider.   Document Released: 02/15/2004 Document Revised: 01/28/2014 Document Reviewed: 09/19/2010 Elsevier Interactive Patient Education [employer].

## 2015-05-28 NOTE — ED Provider Notes (Signed)
[facility] Emergency Department Provider Note   ____________________________________________  Time seen: ~0610  I have reviewed the triage vital signs and the nursing notes.   HISTORY  Chief Complaint Dysuria   History limited by: Not Limited   HPI Alexis Barry is a 81 y.o. female who presents to the emergency department today primary complaint of nausea and pressure in her bladder. The patient states that she has been dealing with pressure in her bladder on and off for a number of months. She does state that she was recently diagnosed with a UTI by her primary care doctor. She has now taken 4 days worth of antibiotic. She has developed nausea since starting the antibiotic. She has not had any associated abdominal pain. She denies any fevers. No vomiting or change in defecation. The nausea has resulted in some decreased appetite.    Past Medical History  Diagnosis Date  . Atrial fibrillation ([place])   . Hypertension   . CHF (congestive heart failure) ([place])   . Goiter   . Mild dementia   . Cancer [facility])     Left breast cancer s/p left mastectomy, chemo and radiation  . Breast cancer ([place]) 08/21/12    left mastectomy    Patient Active Problem List   Diagnosis Date Noted  . Breast cancer ([place]) 08/02/2014  . Chest pain 05/28/2014    Past Surgical History  Procedure Laterality Date  . Breast surgery      Left mastectomy  . Thyroid surgery  1977  . Mastectomy Left 08/21/12    had chemo and rad    Current Outpatient Rx  Name  Route  Sig  Dispense  Refill  . alendronate (FOSAMAX) 70 MG tablet   Oral   Take 1 tablet (70 mg total) by mouth once a week. Take with a full glass of water on an empty stomach.   4 tablet   5   . apixaban (ELIQUIS) 5 MG TABS tablet   Oral   Take 5 mg by mouth 2 (two) times daily.         Marland Kitchen diltiazem (CARDIZEM CD) 180 MG 24 hr capsule   Oral   Take 180 mg by mouth daily.         . furosemide (LASIX) 20 MG  tablet   Oral   Take 40 mg by mouth every morning.         Marland Kitchen letrozole (FEMARA) 2.5 MG tablet   Oral   Take 1 tablet (2.5 mg total) by mouth daily.   30 tablet   0     Needs to schedule appt   . metoprolol (LOPRESSOR) 50 MG tablet   Oral   Take 50 mg by mouth 2 (two) times daily.           Allergies Review of patient's allergies indicates no known allergies.  Family History  Problem Relation Age of Onset  . CAD Father   . Breast cancer Maternal Aunt     Social History Social History  Substance Use Topics  . Smoking status: Never Smoker   . Smokeless tobacco: Never Used  . Alcohol Use: No    Review of Systems  Constitutional: Negative for fever. Cardiovascular: Negative for chest pain. Respiratory: Negative for shortness of breath. Gastrointestinal: Negative for abdominal pain, vomiting and diarrhea.Positive for nausea Neurological: Negative for headaches, focal weakness or numbness.  10-point ROS otherwise negative.  ____________________________________________   PHYSICAL EXAM:  VITAL SIGNS: ED Triage Vitals  Enc Vitals Group     BP 05/28/15 0515 138/70 mmHg     Pulse Rate 05/28/15 0515 76     Resp 05/28/15 0515 18     Temp 05/28/15 0515 98 F (36.7 C)     Temp Source 05/28/15 0515 Oral     SpO2 05/28/15 0515 100 %     Weight --      Height --      Head Cir --      Peak Flow --      Pain Score 05/28/15 0517 7     Pain Loc --      Pain Edu? --      Excl. in [place]? --      Constitutional: Alert and oriented. Well appearing and in no distress. Eyes: Conjunctivae are normal. PERRL. Normal extraocular movements. ENT   Head: Normocephalic and atraumatic.   Nose: No congestion/rhinnorhea.   Mouth/Throat: Mucous membranes are moist.   Neck: No stridor. Hematological/Lymphatic/Immunilogical: No cervical lymphadenopathy. Cardiovascular: Normal rate, regular rhythm.  No murmurs, rubs, or gallops. Respiratory: Normal respiratory effort  without tachypnea nor retractions. Breath sounds are clear and equal bilaterally. No wheezes/rales/rhonchi. Gastrointestinal: Soft and nontender. No distention.  Genitourinary: Deferred Musculoskeletal: Normal range of motion in all extremities. No joint effusions.   Neurologic:  Normal speech and language. No gross focal neurologic deficits are appreciated.  Skin:  Skin is warm, dry and intact. No rash noted. Psychiatric: Mood and affect are normal. Speech and behavior are normal. Patient exhibits appropriate insight and judgment.  ____________________________________________    LABS (pertinent positives/negatives)  Labs Reviewed  URINALYSIS COMPLETEWITH MICROSCOPIC ([place] ONLY) - Abnormal; Notable for the following:    Color, Urine YELLOW (*)    APPearance CLEAR (*)    Leukocytes, UA TRACE (*)    Bacteria, UA RARE (*)    Squamous Epithelial / LPF 6-30 (*)    All other components within normal limits     ____________________________________________   EKG  None  ____________________________________________    RADIOLOGY  None  ____________________________________________   PROCEDURES  Procedure(s) performed: None  Critical Care performed: No  ____________________________________________   INITIAL IMPRESSION / ASSESSMENT AND PLAN / ED COURSE  Pertinent labs & imaging results that were available during my care of the patient were reviewed by me and considered in my medical decision making (see chart for details).  Patient presented to the emergency department today because of concerns for nausea and continued pressure in her bladder. Terms of the nausea think this is likely secondary to antibiotic use. The patient's abdominal exam is completely benign without any pain. The patient additionally does not states she has had any abdominal pain. She was given a Zofran which helped with the nausea. She was then able to drink and states she felt better. Terms of the  urinary pressure UA today without any concerning signs of continued infection. She is scheduled for a bladder scan via her primary care doctor.  ____________________________________________   FINAL CLINICAL IMPRESSION(S) / ED DIAGNOSES  Final diagnoses:  Nausea     Nance Pear, MD 05/28/15 [phone]

## 2015-05-28 NOTE — ED Notes (Signed)
Pt has kept down orange juice with no nausea at this time.

## 2015-05-28 NOTE — ED Notes (Addendum)
Pt presents w/ c/o dysuria and nausea. Pt dx'd w/ a UTI x 4 days ago, started taking Bactim then. Pt reports no improvement in sxs. Pt's daughter reports that the pt's doctor has not called about urine culture results. Pt c/o sense of pressure in pelvis and when trying to urinate.

## 2015-05-31 DIAGNOSIS — R319 Hematuria, unspecified: Secondary | ICD-10-CM | POA: Diagnosis not present

## 2015-06-29 DIAGNOSIS — R319 Hematuria, unspecified: Secondary | ICD-10-CM | POA: Diagnosis not present

## 2015-06-29 DIAGNOSIS — N390 Urinary tract infection, site not specified: Secondary | ICD-10-CM | POA: Diagnosis not present

## 2015-08-17 DIAGNOSIS — R600 Localized edema: Secondary | ICD-10-CM | POA: Diagnosis not present

## 2015-08-17 DIAGNOSIS — E782 Mixed hyperlipidemia: Secondary | ICD-10-CM | POA: Diagnosis not present

## 2015-08-17 DIAGNOSIS — I10 Essential (primary) hypertension: Secondary | ICD-10-CM | POA: Diagnosis not present

## 2015-08-17 DIAGNOSIS — I482 Chronic atrial fibrillation: Secondary | ICD-10-CM | POA: Diagnosis not present

## 2015-08-18 ENCOUNTER — Emergency Department: Payer: Medicare Other

## 2015-08-18 ENCOUNTER — Emergency Department
Admission: EM | Admit: 2015-08-18 | Discharge: 2015-08-18 | Disposition: A | Discharge status: Home / Self Care | Payer: Medicare Other | Attending: Emergency Medicine | Admitting: Emergency Medicine

## 2015-08-18 DIAGNOSIS — Z79899 Other long term (current) drug therapy: Secondary | ICD-10-CM | POA: Insufficient documentation

## 2015-08-18 DIAGNOSIS — Y939 Activity, unspecified: Secondary | ICD-10-CM | POA: Insufficient documentation

## 2015-08-18 DIAGNOSIS — S99911A Unspecified injury of right ankle, initial encounter: Secondary | ICD-10-CM | POA: Diagnosis not present

## 2015-08-18 DIAGNOSIS — I509 Heart failure, unspecified: Secondary | ICD-10-CM | POA: Diagnosis not present

## 2015-08-18 DIAGNOSIS — I110 Hypertensive heart disease with heart failure: Secondary | ICD-10-CM | POA: Insufficient documentation

## 2015-08-18 DIAGNOSIS — S99921A Unspecified injury of right foot, initial encounter: Secondary | ICD-10-CM | POA: Diagnosis present

## 2015-08-18 DIAGNOSIS — Z853 Personal history of malignant neoplasm of breast: Secondary | ICD-10-CM | POA: Diagnosis not present

## 2015-08-18 DIAGNOSIS — Y92000 Kitchen of unspecified non-institutional (private) residence as  the place of occurrence of the external cause: Secondary | ICD-10-CM | POA: Diagnosis not present

## 2015-08-18 DIAGNOSIS — S93401A Sprain of unspecified ligament of right ankle, initial encounter: Secondary | ICD-10-CM | POA: Diagnosis not present

## 2015-08-18 DIAGNOSIS — S86911A Strain of unspecified muscle(s) and tendon(s) at lower leg level, right leg, initial encounter: Secondary | ICD-10-CM | POA: Diagnosis not present

## 2015-08-18 DIAGNOSIS — S8991XA Unspecified injury of right lower leg, initial encounter: Secondary | ICD-10-CM | POA: Diagnosis not present

## 2015-08-18 DIAGNOSIS — S8391XA Sprain of unspecified site of right knee, initial encounter: Secondary | ICD-10-CM | POA: Diagnosis not present

## 2015-08-18 DIAGNOSIS — M25561 Pain in right knee: Secondary | ICD-10-CM | POA: Diagnosis not present

## 2015-08-18 DIAGNOSIS — W010XXA Fall on same level from slipping, tripping and stumbling without subsequent striking against object, initial encounter: Secondary | ICD-10-CM | POA: Insufficient documentation

## 2015-08-18 DIAGNOSIS — M25571 Pain in right ankle and joints of right foot: Secondary | ICD-10-CM | POA: Diagnosis not present

## 2015-08-18 DIAGNOSIS — Y999 Unspecified external cause status: Secondary | ICD-10-CM | POA: Insufficient documentation

## 2015-08-18 MED ORDER — TRAMADOL HCL 50 MG PO TABS
50.0000 mg | ORAL_TABLET | Freq: Once | ORAL | Status: AC
  Administered 2015-08-18: 50 mg via ORAL
  Filled 2015-08-18: qty 1

## 2015-08-18 MED ORDER — TRAMADOL HCL 50 MG PO TABS: 50.0000 mg | ORAL_TABLET | Freq: Four times a day (QID) | ORAL | 0 refills | Status: DC | PRN

## 2015-08-18 NOTE — ED Provider Notes (Signed)
[facility] Emergency Department Provider Note ____________________________________________  Time seen: Approximately 5:01 PM  I have reviewed the triage vital signs and the nursing notes.   HISTORY  Chief Complaint Fall    HPI Alexis Barry is a 81 y.o. female who presents to the emergency department for evaluation after falling in her house this morning. She sustained a mechanical, non-syncopal fall due to water in her kitchen floor. She is complaining of right lower extremity pain. Incident occurred approximately 10 AM this morning.She has not taken any pain medications prior to arrival.  Past Medical History:  Diagnosis Date  . Atrial fibrillation ([place])   . Breast cancer ([place]) 08/21/12   left mastectomy  . Cancer [facility])    Left breast cancer s/p left mastectomy, chemo and radiation  . CHF (congestive heart failure) ([place])   . Goiter   . Hypertension   . Mild dementia     Patient Active Problem List   Diagnosis Date Noted  . Breast cancer ([place]) 08/02/2014  . Chest pain 05/28/2014    Past Surgical History:  Procedure Laterality Date  . BREAST SURGERY     Left mastectomy  . MASTECTOMY Left 08/21/12   had chemo and rad  . THYROID SURGERY  1977    Prior to Admission medications   Medication Sig Start Date End Date Taking? Authorizing Provider  alendronate (FOSAMAX) 70 MG tablet Take 1 tablet (70 mg total) by mouth once a week. Take with a full glass of water on an empty stomach. 04/05/15   Lloyd Huger, MD  apixaban (ELIQUIS) 5 MG TABS tablet Take 5 mg by mouth 2 (two) times daily.    Historical Provider, MD  diltiazem (CARDIZEM CD) 180 MG 24 hr capsule Take 180 mg by mouth daily.    Historical Provider, MD  furosemide (LASIX) 20 MG tablet Take 40 mg by mouth every morning.    Historical Provider, MD  letrozole (FEMARA) 2.5 MG tablet Take 1 tablet (2.5 mg total) by mouth daily. 02/27/15   Lloyd Huger, MD  metoprolol (LOPRESSOR) 50 MG  tablet Take 50 mg by mouth 2 (two) times daily.    Historical Provider, MD  ondansetron (ZOFRAN) 4 MG tablet Take 1 tablet (4 mg total) by mouth daily as needed for nausea or vomiting. 05/28/15   Nance Pear, MD  traMADol (ULTRAM) 50 MG tablet Take 1 tablet (50 mg total) by mouth every 6 (six) hours as needed. 08/18/15   Victorino Dike, FNP    Allergies Review of patient's allergies indicates no known allergies.  Family History  Problem Relation Age of Onset  . CAD Father   . Breast cancer Maternal Aunt     Social History Social History  Substance Use Topics  . Smoking status: Never Smoker  . Smokeless tobacco: Never Used  . Alcohol use No    Review of Systems Constitutional: No recent illness. Cardiovascular: Denies chest pain or palpitations. Respiratory: Denies shortness of breath. Musculoskeletal: Pain in right knee, foot, and ankle. Skin: Negative for rash, wound, lesion. Neurological: Negative for focal weakness or numbness.  ____________________________________________   PHYSICAL EXAM:  VITAL SIGNS: ED Triage Vitals  Enc Vitals Group     BP 08/18/15 1646 (!) 148/78     Pulse Rate 08/18/15 1646 (!) 101     Resp 08/18/15 1646 18     Temp 08/18/15 1646 97.9 F (36.6 C)     Temp Source 08/18/15 1646 Oral  SpO2 08/18/15 1646 95 %     Weight 08/18/15 1646 172 lb (78 kg)     Height 08/18/15 1646 5\' 7"  (1.702 m)     Head Circumference --      Peak Flow --      Pain Score 08/18/15 1647 10     Pain Loc --      Pain Edu? --      Excl. in [place]? --     Constitutional: Alert and oriented. Well appearing and in no acute distress. Eyes: Conjunctivae are normal. EOMI. Head: Atraumatic. Neck: No stridor.  Respiratory: Normal respiratory effort.   Musculoskeletal: Swelling and tenderness over the right patella. Full range of motion of the right knee is present. Swelling noted to the right ankle in the ATFL pattern. Range of motion of the right ankle is limited due  to pain. Remainder of the extremities with full range of motion and atraumatic. Neurologic:  Normal speech and language. No gross focal neurologic deficits are appreciated. Speech is normal. No gait instability. Skin:  Skin is warm, dry and intact. Atraumatic. Psychiatric: Mood and affect are normal. Speech and behavior are normal.  ____________________________________________   LABS (all labs ordered are listed, but only abnormal results are displayed)  Labs Reviewed - No data to display ____________________________________________  RADIOLOGY  No acute bony abnormality of the right knee or the right ankle per radiology.  I, Sherrie George, personally viewed and evaluated these images (plain radiographs) as part of my medical decision making, as well as reviewing the written report by the radiologist.  ___________________________________________   PROCEDURES  Procedure(s) performed:  Prefabricated, Velcro ankle stirrup splint applied to the right ankle by Beverlee Nims, ER Tech. Knee immobilizer applied to the right knee by Beverlee Nims, ER Tech. Patient was neurovascularly intact post-application of splints.   ____________________________________________   INITIAL IMPRESSION / ASSESSMENT AND PLAN / ED COURSE  Pertinent labs & imaging results that were available during my care of the patient were reviewed by me and considered in my medical decision making (see chart for details).  Patient was encouraged to rest, ice, and elevate the right lower extremity. She was encouraged to take the tramadol as prescribed. She was encouraged to schedule a follow-up appointment with orthopedics for symptoms that are not improving over the week. She was instructed to return to the emergency department for symptoms that change or worsen if she is unable to schedule an appointment with orthopedics or her primary care provider. ____________________________________________   FINAL CLINICAL IMPRESSION(S) / ED  DIAGNOSES  Final diagnoses:  Ankle sprain, right, initial encounter  Knee sprain and strain, right, initial encounter       Victorino Dike, FNP 08/18/15 1856    Delman Kitten, MD 08/18/15 2217

## 2015-08-18 NOTE — ED Notes (Signed)
See triage note  Slipped in water  Having pain to right knee and ankle     Increased pain with standing

## 2015-08-18 NOTE — ED Notes (Signed)
Pt via wheelchair from [facility], slipped and fell on her right side, on blood thinners, no acute distress

## 2015-08-18 NOTE — ED Triage Notes (Signed)
Pt states that she slipped on water in her floor at home and fell. Pt is c/o rt knee, ankle, and top of rt foot pain

## 2015-09-07 NOTE — Progress Notes (Deleted)
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   INTERVAL HISTORY: Patient returns to clinic today for routine 6 month evaluation.  She continues to feel well and is asymptomatic.  She is tolerating letrozole and Fosamax without significant side effects.  She does not complain of peripheral neuropathy today.  She does not complain of chest pain or shortness of breath. She denies any recent fevers.  She denies any nausea, vomiting, constipation, or diarrhea.  Patient offers no specific complaints today.  REVIEW OF SYSTEMS:   Review of Systems  Constitutional: Negative.   Respiratory: Negative.   Cardiovascular: Negative.     As per HPI. Otherwise, a complete review of systems is negatve.  PAST MEDICAL HISTORY: Past Medical History:  Diagnosis Date  . Atrial fibrillation ([place])   . Breast cancer ([place]) 08/21/12   left mastectomy  . Cancer [facility])    Left breast cancer s/p left mastectomy, chemo and radiation  . CHF (congestive heart failure) ([place])   . Goiter   . Hypertension   . Mild dementia     PAST SURGICAL HISTORY: Past Surgical History:  Procedure Laterality Date  . BREAST SURGERY     Left mastectomy  . MASTECTOMY Left 08/21/12   had chemo and rad  . THYROID SURGERY  1977    FAMILY HISTORY Family History  Problem Relation Age of Onset  . CAD Father   . Breast cancer Maternal Aunt        ADVANCED DIRECTIVES:    HEALTH MAINTENANCE: Social History  Substance Use Topics  . Smoking status: Never Smoker  . Smokeless tobacco: Never Used  . Alcohol use No     Colonoscopy:  PAP:  Bone density:  Lipid panel:  No Known Allergies  Current Outpatient  Prescriptions  Medication Sig Dispense Refill  . alendronate (FOSAMAX) 70 MG tablet Take 1 tablet (70 mg total) by mouth once a week. Take with a full glass of water on an empty stomach. 4 tablet 5  . apixaban (ELIQUIS) 5 MG TABS tablet Take 5 mg by mouth 2 (two) times daily.    Marland Kitchen diltiazem (CARDIZEM CD) 180 MG 24 hr capsule Take 180 mg by mouth daily.    . furosemide (LASIX) 20 MG tablet Take 40 mg by mouth every morning.    Marland Kitchen letrozole (FEMARA) 2.5 MG tablet Take 1 tablet (2.5 mg total) by mouth daily. 30 tablet 0  . metoprolol (LOPRESSOR) 50 MG tablet Take 50 mg by mouth 2 (two) times daily.    . ondansetron (ZOFRAN) 4 MG tablet Take 1 tablet (4 mg total) by mouth daily as needed for nausea or vomiting. 20 tablet 0  . traMADol (ULTRAM) 50 MG tablet Take 1 tablet (50 mg total) by mouth every 6 (six) hours as needed. 12 tablet 0   No current facility-administered medications for this visit.     OBJECTIVE: There were no vitals filed for this visit.   There is no height or weight on file to calculate BMI.    ECOG FS:0 - Asymptomatic  General: Well-developed, well-nourished, no acute distress. Eyes: anicteric sclera. Breasts: Left mastectomy, patient requested exam be deferred today. Lungs: Clear to auscultation bilaterally. Heart: Regular rate and rhythm. No  rubs, murmurs, or gallops. Abdomen: Soft, nontender, nondistended. No organomegaly noted, normoactive bowel sounds. Musculoskeletal: No edema, cyanosis, or clubbing. Neuro: Alert, answering all questions appropriately. Cranial nerves grossly intact. Skin: No rashes or petechiae noted. Psych: Normal affect.  LAB RESULTS:  Lab Results  Component Value Date   NA 132 (L) 05/29/2014   K 3.8 05/29/2014   CL 98 (L) 05/29/2014   CO2 26 05/29/2014   GLUCOSE 138 (H) 05/29/2014   BUN 11 05/29/2014   CREATININE 0.66 05/29/2014   CALCIUM 8.6 (L) 05/29/2014   PROT 6.5 10/01/2012   ALBUMIN 3.6 10/01/2012   AST 17 10/01/2012   ALT 12  10/01/2012   ALKPHOS 94 10/01/2012   BILITOT 0.3 10/01/2012   GFRNONAA >60 05/29/2014   GFRAA >60 05/29/2014    Lab Results  Component Value Date   WBC 9.6 05/29/2014   NEUTROABS 3.3 12/11/2012   HGB 13.1 05/29/2014   HCT 38.2 05/29/2014   MCV 91.7 05/29/2014   PLT 151 05/29/2014   Lab Results  Component Value Date   LABCA2 26.3 03/10/2015     STUDIES: Dg Ankle Complete Right  Result Date: 08/18/2015 CLINICAL DATA:  Fall with pain. EXAM: RIGHT KNEE - COMPLETE 4+ VIEW; RIGHT ANKLE - COMPLETE 3+ VIEW COMPARISON:  None. FINDINGS: No evidence of fracture, dislocation, or joint effusion. There is diffuse osteopenia. No significant arthropathy. Vascular calcifications are noted in the soft tissues. Soft tissues are otherwise unremarkable. IMPRESSION: No acute fracture or dislocation identified about the right knee or right ankle. Electronically Signed   By: Fidela Salisbury M.D.   On: 08/18/2015 17:26  Dg Knee Complete 4 Views Right  Result Date: 08/18/2015 CLINICAL DATA:  Fall with pain. EXAM: RIGHT KNEE - COMPLETE 4+ VIEW; RIGHT ANKLE - COMPLETE 3+ VIEW COMPARISON:  None. FINDINGS: No evidence of fracture, dislocation, or joint effusion. There is diffuse osteopenia. No significant arthropathy. Vascular calcifications are noted in the soft tissues. Soft tissues are otherwise unremarkable. IMPRESSION: No acute fracture or dislocation identified about the right knee or right ankle. Electronically Signed   By: Fidela Salisbury M.D.   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  PLAN:    1.  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  Given the ER/PR status of her tumor, she will take letrozole for a total of 5 years completing  in December 2019. CA-27-29 continues to be within normal limits. Her most recent mammogram on January 12, 2014 was reported as BI-RADS 1. Return to clinic in 6 months for routine evaluation. Patient did not have mammogram in December 2016, it will be scheduled today to be completed in the next 1-2 weeks. 2. Osteoporosis: Bone mineral density on January 12, 2014 revealed a T score of -3.4 which is improved over 1 year prior.  Continue Fosamax, calcium, and vitamin D. Patient did not get her bone density in December 2016, it will be scheduled today to be completed in the next 1-2 weeks.  3.  Thyroid nodule: Unclear etiology.  Continue to monitor and if nodule increases in size we will refer her to ENT for biopsy.  4.  Peripheral edema: Resolved. Continue Lasix as needed. 5.  Peripheral neuropathy: Resolved. Continue B complex vitamin.  Patient expressed understanding and was in agreement with this  plan. She also understands that She can call clinic at any time with any questions, concerns, or complaints.   Breast cancer   Staging form: Breast, AJCC 7th Edition     Clinical stage from 08/02/2014: Stage IIIB (T4, N0, M0) - Signed by Lloyd Huger, MD on 08/02/2014   Lloyd Huger, MD   09/07/2015 11:04 PM  Patient seen and evaluated independently and I agree with the assessment and plan as dictated above.  Lloyd Huger, MD 09/07/15 11:04 PM

## 2015-09-08 ENCOUNTER — Inpatient Hospital Stay: Payer: Medicare Other | Admitting: Oncology

## 2015-09-08 ENCOUNTER — Inpatient Hospital Stay: Payer: Medicare Other

## 2015-09-21 ENCOUNTER — Other Ambulatory Visit: Payer: Self-pay | Admitting: *Deleted

## 2015-09-21 DIAGNOSIS — Z853 Personal history of malignant neoplasm of breast: Secondary | ICD-10-CM

## 2015-09-21 NOTE — Progress Notes (Signed)
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   INTERVAL HISTORY: Patient returns to clinic today for routine 6 month evaluation.  She continues to feel well and is asymptomatic.  She is tolerating letrozole and Fosamax without significant side effects.  She does not complain of peripheral neuropathy today.  She does not complain of chest pain or shortness of breath. She denies any recent fevers.  She denies any nausea, vomiting, constipation, or diarrhea.  Patient offers no specific complaints today.  REVIEW OF SYSTEMS:   Review of Systems  Constitutional: Negative.  Negative for fever, malaise/fatigue and weight loss.  Respiratory: Negative.  Negative for cough and shortness of breath.   Cardiovascular: Negative.  Negative for chest pain.  Gastrointestinal: Negative.  Negative for abdominal pain.  Genitourinary: Negative.   Musculoskeletal: Negative.   Neurological: Negative.  Negative for weakness.  Psychiatric/Behavioral: Negative.  The patient is not nervous/anxious.     As per HPI. Otherwise, a complete review of systems is negative.  PAST MEDICAL HISTORY: Past Medical History:  Diagnosis Date  . Atrial fibrillation ([place])   . Breast cancer ([place]) 08/21/12   left mastectomy  . Cancer [facility])    Left breast cancer s/p left mastectomy, chemo and radiation  . CHF (congestive heart failure) ([place])   . Goiter   . Hypertension   . Mild dementia     PAST SURGICAL HISTORY: Past Surgical History:  Procedure Laterality Date  . BREAST SURGERY     Left mastectomy  . MASTECTOMY Left 08/21/12   had chemo and rad  . THYROID SURGERY  1977    FAMILY HISTORY Family History    Problem Relation Age of Onset  . CAD Father   . Breast cancer Maternal Aunt        ADVANCED DIRECTIVES:    HEALTH MAINTENANCE: Social History  Substance Use Topics  . Smoking status: Never Smoker  . Smokeless tobacco: Never Used  . Alcohol use No     Colonoscopy:  PAP:  Bone density:  Lipid panel:  No Known Allergies  Current Outpatient Prescriptions  Medication Sig Dispense Refill  . alendronate (FOSAMAX) 70 MG tablet Take 1 tablet (70 mg total) by mouth once a week. Take with a full glass of water on an empty stomach. 4 tablet 5  . apixaban (ELIQUIS) 5 MG TABS tablet Take 5 mg by mouth 2 (two) times daily.    Marland Kitchen diltiazem (CARDIZEM CD) 180 MG 24 hr capsule Take 180 mg by mouth daily.    . furosemide (LASIX) 20 MG tablet Take 40 mg by mouth every morning.    Marland Kitchen letrozole (FEMARA) 2.5 MG tablet Take 1 tablet (2.5 mg total) by mouth daily. 30 tablet 0  . metoprolol (LOPRESSOR) 50 MG tablet Take 50 mg by mouth 2 (two) times daily.    . ondansetron (ZOFRAN) 4 MG tablet Take 1 tablet (4 mg total) by mouth daily as needed for nausea or vomiting. 20 tablet 0  . traMADol (ULTRAM) 50 MG tablet Take 1 tablet (50 mg total) by mouth every 6 (six) hours as needed. 12 tablet 0   No current facility-administered medications for this visit.     OBJECTIVE: Vitals:   09/22/15 0915  BP: (!) 146/84  Pulse: 78  Resp: 20  Temp: (!) 96.8 F (36 C)     Body mass index is 26.14 kg/m.    ECOG FS:0 - Asymptomatic  General: Well-developed, well-nourished, no acute distress. Eyes: anicteric sclera. Breasts: Left mastectomy, patient requested exam be deferred today. Lungs: Clear to auscultation bilaterally. Heart: Regular rate and rhythm. No rubs, murmurs, or gallops. Abdomen: Soft, nontender, nondistended. No organomegaly noted, normoactive bowel sounds. Musculoskeletal: No edema, cyanosis, or clubbing. Neuro: Alert, answering all questions appropriately. Cranial nerves grossly  intact. Skin: No rashes or petechiae noted. Psych: Normal affect.  LAB RESULTS:  Lab Results  Component Value Date   NA 132 (L) 05/29/2014   K 3.8 05/29/2014   CL 98 (L) 05/29/2014   CO2 26 05/29/2014   GLUCOSE 138 (H) 05/29/2014   BUN 11 05/29/2014   CREATININE 0.66 05/29/2014   CALCIUM 8.6 (L) 05/29/2014   PROT 6.5 10/01/2012   ALBUMIN 3.6 10/01/2012   AST 17 10/01/2012   ALT 12 10/01/2012   ALKPHOS 94 10/01/2012   BILITOT 0.3 10/01/2012   GFRNONAA >60 05/29/2014   GFRAA >60 05/29/2014    Lab Results  Component Value Date   WBC 9.6 05/29/2014   NEUTROABS 3.3 12/11/2012   HGB 13.1 05/29/2014   HCT 38.2 05/29/2014   MCV 91.7 05/29/2014   PLT 151 05/29/2014   Lab Results  Component Value Date   LABCA2 26.3 03/10/2015     STUDIES: No results found.  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  PLAN:    1.  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  Given the ER/PR status of her tumor, she will take letrozole for a total of 5 years completing in December 2019. CA-27-29 continues to be within normal limits. Her most recent mammogram on April 17, 2015 was reported as BI-RADS 1. Repeat in March 2018. Return to clinic in 6 months for routine evaluation. 2. Osteoporosis: Bone mineral density on January 12, 2014 revealed a T score of -3.4 which is improved over 1 year prior.  Continue Fosamax, calcium, and vitamin D. Patient has skipped multiple appointments for repeat bone mineral density in the past, therefore, it will be rescheduled today to be completed in the next 1-2 weeks.  3.  Thyroid nodule: Unclear etiology.  Continue to monitor and if nodule increases in size we will refer her to ENT for biopsy.   Patient expressed understanding  and was in agreement with this plan. She also understands that She can call clinic at any time with any questions, concerns, or complaints.   Breast cancer   Staging form: Breast, AJCC 7th Edition     Clinical stage from 08/02/2014: Stage IIIB (T4, N0, M0) - Signed by Lloyd Huger, MD on 08/02/2014   Lloyd Huger, MD   09/22/2015 3:47 PM

## 2015-09-22 ENCOUNTER — Inpatient Hospital Stay: Payer: Medicare Other

## 2015-09-22 ENCOUNTER — Inpatient Hospital Stay: Payer: Medicare Other | Attending: Oncology | Admitting: Oncology

## 2015-09-22 VITALS — BP 146/84 | HR 78 | Temp 96.8°F | Resp 20 | Wt 166.9 lb

## 2015-09-22 DIAGNOSIS — Z170 Estrogen receptor positive status [ER+]: Secondary | ICD-10-CM | POA: Diagnosis not present

## 2015-09-22 DIAGNOSIS — Z7901 Long term (current) use of anticoagulants: Secondary | ICD-10-CM | POA: Diagnosis not present

## 2015-09-22 DIAGNOSIS — Z9012 Acquired absence of left breast and nipple: Secondary | ICD-10-CM | POA: Diagnosis not present

## 2015-09-22 DIAGNOSIS — I4891 Unspecified atrial fibrillation: Secondary | ICD-10-CM | POA: Insufficient documentation

## 2015-09-22 DIAGNOSIS — Z853 Personal history of malignant neoplasm of breast: Secondary | ICD-10-CM

## 2015-09-22 DIAGNOSIS — Z79811 Long term (current) use of aromatase inhibitors: Secondary | ICD-10-CM | POA: Insufficient documentation

## 2015-09-22 DIAGNOSIS — Z79899 Other long term (current) drug therapy: Secondary | ICD-10-CM | POA: Insufficient documentation

## 2015-09-22 DIAGNOSIS — F0390 Unspecified dementia without behavioral disturbance: Secondary | ICD-10-CM | POA: Insufficient documentation

## 2015-09-22 DIAGNOSIS — Z923 Personal history of irradiation: Secondary | ICD-10-CM | POA: Insufficient documentation

## 2015-09-22 DIAGNOSIS — Z9221 Personal history of antineoplastic chemotherapy: Secondary | ICD-10-CM | POA: Diagnosis not present

## 2015-09-22 DIAGNOSIS — I10 Essential (primary) hypertension: Secondary | ICD-10-CM | POA: Insufficient documentation

## 2015-09-22 DIAGNOSIS — E041 Nontoxic single thyroid nodule: Secondary | ICD-10-CM

## 2015-09-22 DIAGNOSIS — M818 Other osteoporosis without current pathological fracture: Secondary | ICD-10-CM | POA: Diagnosis not present

## 2015-09-22 DIAGNOSIS — I509 Heart failure, unspecified: Secondary | ICD-10-CM | POA: Diagnosis not present

## 2015-09-22 DIAGNOSIS — C50512 Malignant neoplasm of lower-outer quadrant of left female breast: Secondary | ICD-10-CM | POA: Diagnosis not present

## 2015-09-22 NOTE — Progress Notes (Signed)
Patient here today for routine follow up regarding breast cancer. Patient denies any concerns today.

## 2015-09-23 LAB — CANCER ANTIGEN 27.29: CA 27.29: 22.8 U/mL (ref 0.0–38.6)

## 2015-09-26 ENCOUNTER — Other Ambulatory Visit: Payer: Self-pay | Admitting: *Deleted

## 2015-09-26 ENCOUNTER — Telehealth: Payer: Self-pay | Admitting: Oncology

## 2015-09-26 MED ORDER — LETROZOLE 2.5 MG PO TABS: 2.5000 mg | ORAL_TABLET | Freq: Every day | ORAL | 6 refills | Status: DC

## 2015-09-26 NOTE — Telephone Encounter (Signed)
Done,  Thank you

## 2015-09-26 NOTE — Telephone Encounter (Signed)
Patient said she needs Woodfin Ganja to please call in her [facility] Rx to her pharmacy. Thanks.

## 2015-09-28 ENCOUNTER — Ambulatory Visit
Admission: RE | Admit: 2015-09-28 | Discharge: 2015-09-28 | Disposition: A | Discharge status: Home / Self Care | Payer: Medicare Other | Source: Ambulatory Visit | Attending: Oncology | Admitting: Oncology

## 2015-09-28 DIAGNOSIS — C50512 Malignant neoplasm of lower-outer quadrant of left female breast: Secondary | ICD-10-CM | POA: Diagnosis not present

## 2015-09-28 DIAGNOSIS — Z780 Asymptomatic menopausal state: Secondary | ICD-10-CM | POA: Insufficient documentation

## 2015-09-28 DIAGNOSIS — Z853 Personal history of malignant neoplasm of breast: Secondary | ICD-10-CM | POA: Insufficient documentation

## 2015-09-28 DIAGNOSIS — M810 Age-related osteoporosis without current pathological fracture: Secondary | ICD-10-CM | POA: Insufficient documentation

## 2015-10-26 ENCOUNTER — Ambulatory Visit
Admission: RE | Admit: 2015-10-26 | Discharge: 2015-10-26 | Disposition: A | Discharge status: Home / Self Care | Payer: Medicare Other | Source: Ambulatory Visit | Attending: Physician Assistant | Admitting: Physician Assistant

## 2015-10-26 ENCOUNTER — Other Ambulatory Visit: Payer: Self-pay | Admitting: Physician Assistant

## 2015-10-26 DIAGNOSIS — R52 Pain, unspecified: Secondary | ICD-10-CM

## 2015-10-26 DIAGNOSIS — M810 Age-related osteoporosis without current pathological fracture: Secondary | ICD-10-CM | POA: Diagnosis not present

## 2015-10-26 DIAGNOSIS — M79675 Pain in left toe(s): Secondary | ICD-10-CM | POA: Insufficient documentation

## 2015-10-26 DIAGNOSIS — M79672 Pain in left foot: Secondary | ICD-10-CM | POA: Diagnosis not present

## 2015-10-26 DIAGNOSIS — Z0001 Encounter for general adult medical examination with abnormal findings: Secondary | ICD-10-CM | POA: Diagnosis not present

## 2015-10-26 DIAGNOSIS — M7989 Other specified soft tissue disorders: Secondary | ICD-10-CM | POA: Diagnosis not present

## 2015-10-26 DIAGNOSIS — N390 Urinary tract infection, site not specified: Secondary | ICD-10-CM | POA: Diagnosis not present

## 2015-10-26 DIAGNOSIS — G620 Drug-induced polyneuropathy: Secondary | ICD-10-CM | POA: Diagnosis not present

## 2015-10-26 DIAGNOSIS — E040 Nontoxic diffuse goiter: Secondary | ICD-10-CM | POA: Diagnosis not present

## 2015-10-26 DIAGNOSIS — I482 Chronic atrial fibrillation: Secondary | ICD-10-CM | POA: Diagnosis not present

## 2015-10-26 DIAGNOSIS — M25572 Pain in left ankle and joints of left foot: Secondary | ICD-10-CM | POA: Diagnosis not present

## 2015-11-01 DIAGNOSIS — E040 Nontoxic diffuse goiter: Secondary | ICD-10-CM | POA: Diagnosis not present

## 2015-11-23 DIAGNOSIS — I10 Essential (primary) hypertension: Secondary | ICD-10-CM | POA: Diagnosis not present

## 2015-11-23 DIAGNOSIS — Z0001 Encounter for general adult medical examination with abnormal findings: Secondary | ICD-10-CM | POA: Diagnosis not present

## 2015-11-23 DIAGNOSIS — M79672 Pain in left foot: Secondary | ICD-10-CM | POA: Diagnosis not present

## 2015-11-23 DIAGNOSIS — R040 Epistaxis: Secondary | ICD-10-CM | POA: Diagnosis not present

## 2015-11-29 DIAGNOSIS — I482 Chronic atrial fibrillation: Secondary | ICD-10-CM | POA: Diagnosis not present

## 2015-11-29 DIAGNOSIS — E040 Nontoxic diffuse goiter: Secondary | ICD-10-CM | POA: Diagnosis not present

## 2015-11-29 DIAGNOSIS — N390 Urinary tract infection, site not specified: Secondary | ICD-10-CM | POA: Diagnosis not present

## 2016-02-29 DIAGNOSIS — I10 Essential (primary) hypertension: Secondary | ICD-10-CM | POA: Diagnosis not present

## 2016-02-29 DIAGNOSIS — C50912 Malignant neoplasm of unspecified site of left female breast: Secondary | ICD-10-CM | POA: Diagnosis not present

## 2016-02-29 DIAGNOSIS — I482 Chronic atrial fibrillation: Secondary | ICD-10-CM | POA: Diagnosis not present

## 2016-02-29 DIAGNOSIS — E041 Nontoxic single thyroid nodule: Secondary | ICD-10-CM | POA: Diagnosis not present

## 2016-03-07 DIAGNOSIS — E782 Mixed hyperlipidemia: Secondary | ICD-10-CM | POA: Diagnosis not present

## 2016-03-07 DIAGNOSIS — R238 Other skin changes: Secondary | ICD-10-CM | POA: Diagnosis not present

## 2016-03-07 DIAGNOSIS — I10 Essential (primary) hypertension: Secondary | ICD-10-CM | POA: Diagnosis not present

## 2016-03-07 DIAGNOSIS — I482 Chronic atrial fibrillation: Secondary | ICD-10-CM | POA: Diagnosis not present

## 2016-03-13 DIAGNOSIS — E876 Hypokalemia: Secondary | ICD-10-CM | POA: Diagnosis not present

## 2016-03-15 ENCOUNTER — Other Ambulatory Visit: Payer: Self-pay

## 2016-03-15 DIAGNOSIS — C50512 Malignant neoplasm of lower-outer quadrant of left female breast: Secondary | ICD-10-CM

## 2016-03-15 MED ORDER — ALENDRONATE SODIUM 70 MG PO TABS: 70.0000 mg | ORAL_TABLET | ORAL | 5 refills | Status: DC

## 2016-03-27 NOTE — Progress Notes (Signed)
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   INTERVAL HISTORY: Patient returns to clinic today for routine 6 month evaluation.  She continues to feel well and is asymptomatic.  She is tolerating letrozole and Fosamax without significant side effects. She has no neurologic complaints. She denies any recent fevers or illnesses. She does not complain of peripheral neuropathy today.  She does not complain of chest pain or shortness of breath. She denies any nausea, vomiting, constipation, or diarrhea. She has no urinary complaints. Patient offers no specific complaints today.  REVIEW OF SYSTEMS:   Review of Systems  Constitutional: Negative.  Negative for fever, malaise/fatigue and weight loss.  Respiratory: Negative.  Negative for cough and shortness of breath.   Cardiovascular: Negative.  Negative for chest pain and leg swelling.  Gastrointestinal: Negative.  Negative for abdominal pain.  Genitourinary: Negative.   Musculoskeletal: Negative.   Neurological: Negative.  Negative for sensory change and weakness.  Psychiatric/Behavioral: Negative.  The patient is not nervous/anxious.     As per HPI. Otherwise, a complete review of systems is negative.  PAST MEDICAL HISTORY: Past Medical History:  Diagnosis Date  . Atrial fibrillation ([place])   . Breast cancer ([place]) 08/21/12   left mastectomy  . Cancer [facility])    Left breast cancer s/p left mastectomy, chemo and radiation  . CHF (congestive heart failure) ([place])   . Goiter   . Hypertension   . Mild dementia     PAST SURGICAL HISTORY: Past Surgical History:  Procedure Laterality Date  . BREAST SURGERY     Left mastectomy  .  MASTECTOMY Left 08/21/12   had chemo and rad  . THYROID SURGERY  1977    FAMILY HISTORY Family History  Problem Relation Age of Onset  . CAD Father   . Breast cancer Maternal Aunt        ADVANCED DIRECTIVES:    HEALTH MAINTENANCE: Social History  Substance Use Topics  . Smoking status: Never Smoker  . Smokeless tobacco: Never Used  . Alcohol use No     Colonoscopy:  PAP:  Bone density:  Lipid panel:  No Known Allergies  Current Outpatient Prescriptions  Medication Sig Dispense Refill  . alendronate (FOSAMAX) 70 MG tablet Take 1 tablet (70 mg total) by mouth once a week. Take with a full glass of water on an empty stomach. 4 tablet 5  . apixaban (ELIQUIS) 5 MG TABS tablet Take 5 mg by mouth 2 (two) times daily.    Marland Kitchen diltiazem (CARDIZEM CD) 180 MG 24 hr capsule Take 180 mg by mouth daily.    . furosemide (LASIX) 20 MG tablet Take 40 mg by mouth every morning.    Marland Kitchen letrozole (FEMARA) 2.5 MG tablet Take 1 tablet (2.5 mg total) by mouth daily. 30 tablet 6  . metoprolol (LOPRESSOR) 50 MG tablet Take 50 mg by mouth 2 (two) times daily.    . ondansetron (ZOFRAN) 4 MG tablet Take 1 tablet (4 mg total) by mouth daily as needed for nausea or vomiting. (Patient not taking: Reported on 03/29/2016) 20 tablet 0  . traMADol (ULTRAM) 50 MG tablet Take 1 tablet (50 mg total) by mouth every 6 (six) hours as needed. (Patient not  taking: Reported on 03/29/2016) 12 tablet 0   No current facility-administered medications for this visit.     OBJECTIVE: Vitals:   03/29/16 0912  BP: (!) 149/91  Pulse: 74  Resp: 18  Temp: (!) 96.5 F (35.8 C)     Body mass index is 25.48 kg/m.    ECOG FS:0 - Asymptomatic  General: Well-developed, well-nourished, no acute distress. Eyes: anicteric sclera. Breasts: Left mastectomy, patient requested exam be deferred today. Lungs: Clear to auscultation bilaterally. Heart: Regular rate and rhythm. No rubs, murmurs, or gallops. Abdomen: Soft, nontender,  nondistended. No organomegaly noted, normoactive bowel sounds. Musculoskeletal: No edema, cyanosis, or clubbing. Neuro: Alert, answering all questions appropriately. Cranial nerves grossly intact. Skin: No rashes or petechiae noted. Psych: Normal affect.  LAB RESULTS:  Lab Results  Component Value Date   NA 132 (L) 05/29/2014   K 3.8 05/29/2014   CL 98 (L) 05/29/2014   CO2 26 05/29/2014   GLUCOSE 138 (H) 05/29/2014   BUN 11 05/29/2014   CREATININE 0.66 05/29/2014   CALCIUM 8.6 (L) 05/29/2014   PROT 6.5 10/01/2012   ALBUMIN 3.6 10/01/2012   AST 17 10/01/2012   ALT 12 10/01/2012   ALKPHOS 94 10/01/2012   BILITOT 0.3 10/01/2012   GFRNONAA >60 05/29/2014   GFRAA >60 05/29/2014    Lab Results  Component Value Date   WBC 9.6 05/29/2014   NEUTROABS 3.3 12/11/2012   HGB 13.1 05/29/2014   HCT 38.2 05/29/2014   MCV 91.7 05/29/2014   PLT 151 05/29/2014   Lab Results  Component Value Date   LABCA2 22.8 09/22/2015     STUDIES: No results found.  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  PLAN:    1.  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  Given the ER/PR status of her tumor, she will take letrozole for a total of 5 years completing in December 2019. CA-27-29 continues to be within normal limits. Her most recent mammogram on April 17, 2015 was reported as BI-RADS 1. Repeat in the next 1-2 weeks. Return to clinic in 6 months for routine evaluation. 2. Osteoporosis: Bone mineral density on September 28, 2015 revealed a T score of -3.3 which is improved over her previous scan.  Continue Fosamax, calcium, and vitamin D. Repeat one to 2 days prior to next appointment in September.  3.  Thyroid nodule: Unclear etiology.  Continue to monitor and if  nodule increases in size we will refer her to ENT for biopsy.   Patient expressed understanding and was in agreement with this plan. She also understands that She can call clinic at any time with any questions, concerns, or complaints.   Breast cancer   Staging form: Breast, AJCC 7th Edition     Clinical stage from 08/02/2014: Stage IIIB (T4, N0, M0) - Signed by Lloyd Huger, MD on 08/02/2014   Lloyd Huger, MD   03/29/2016 10:01 AM

## 2016-03-29 ENCOUNTER — Inpatient Hospital Stay: Payer: Medicare Other | Attending: Oncology

## 2016-03-29 ENCOUNTER — Inpatient Hospital Stay (HOSPITAL_BASED_OUTPATIENT_CLINIC_OR_DEPARTMENT_OTHER): Payer: Medicare Other | Admitting: Oncology

## 2016-03-29 ENCOUNTER — Other Ambulatory Visit: Payer: Self-pay | Admitting: Oncology

## 2016-03-29 VITALS — BP 149/91 | HR 74 | Temp 96.5°F | Resp 18 | Wt 162.7 lb

## 2016-03-29 DIAGNOSIS — Z9221 Personal history of antineoplastic chemotherapy: Secondary | ICD-10-CM

## 2016-03-29 DIAGNOSIS — F0390 Unspecified dementia without behavioral disturbance: Secondary | ICD-10-CM | POA: Diagnosis not present

## 2016-03-29 DIAGNOSIS — Z9012 Acquired absence of left breast and nipple: Secondary | ICD-10-CM | POA: Diagnosis not present

## 2016-03-29 DIAGNOSIS — E041 Nontoxic single thyroid nodule: Secondary | ICD-10-CM | POA: Insufficient documentation

## 2016-03-29 DIAGNOSIS — Z923 Personal history of irradiation: Secondary | ICD-10-CM | POA: Diagnosis not present

## 2016-03-29 DIAGNOSIS — Z79811 Long term (current) use of aromatase inhibitors: Secondary | ICD-10-CM | POA: Insufficient documentation

## 2016-03-29 DIAGNOSIS — C50512 Malignant neoplasm of lower-outer quadrant of left female breast: Secondary | ICD-10-CM

## 2016-03-29 DIAGNOSIS — I4891 Unspecified atrial fibrillation: Secondary | ICD-10-CM | POA: Diagnosis not present

## 2016-03-29 DIAGNOSIS — Z170 Estrogen receptor positive status [ER+]: Secondary | ICD-10-CM

## 2016-03-29 DIAGNOSIS — M818 Other osteoporosis without current pathological fracture: Secondary | ICD-10-CM | POA: Diagnosis not present

## 2016-03-29 DIAGNOSIS — I509 Heart failure, unspecified: Secondary | ICD-10-CM | POA: Insufficient documentation

## 2016-03-29 DIAGNOSIS — Z853 Personal history of malignant neoplasm of breast: Secondary | ICD-10-CM

## 2016-03-29 DIAGNOSIS — I110 Hypertensive heart disease with heart failure: Secondary | ICD-10-CM | POA: Insufficient documentation

## 2016-03-29 NOTE — Progress Notes (Signed)
Patient is here for follow up, she is doing well. She does have some swelling in her ankles.

## 2016-03-30 LAB — CANCER ANTIGEN 27.29: CA 27.29: 34 U/mL (ref 0.0–38.6)

## 2016-04-15 ENCOUNTER — Inpatient Hospital Stay: Admission: RE | Admit: 2016-04-15 | Payer: Medicare Other | Source: Ambulatory Visit

## 2016-04-15 ENCOUNTER — Ambulatory Visit: Payer: Medicare Other

## 2016-04-22 ENCOUNTER — Other Ambulatory Visit: Payer: Self-pay

## 2016-04-22 MED ORDER — LETROZOLE 2.5 MG PO TABS: 2.5000 mg | ORAL_TABLET | Freq: Every day | ORAL | 4 refills | Status: DC

## 2016-04-24 ENCOUNTER — Ambulatory Visit
Admission: RE | Admit: 2016-04-24 | Discharge: 2016-04-24 | Disposition: A | Discharge status: Home / Self Care | Payer: Medicare Other | Source: Ambulatory Visit | Attending: Oncology | Admitting: Oncology

## 2016-04-24 DIAGNOSIS — Z1231 Encounter for screening mammogram for malignant neoplasm of breast: Secondary | ICD-10-CM | POA: Diagnosis not present

## 2016-04-24 DIAGNOSIS — Z853 Personal history of malignant neoplasm of breast: Secondary | ICD-10-CM

## 2016-04-24 HISTORY — DX: Personal history of irradiation: Z92.3

## 2016-04-24 HISTORY — DX: Personal history of antineoplastic chemotherapy: Z92.21

## 2016-05-03 ENCOUNTER — Ambulatory Visit
Admission: EM | Admit: 2016-05-03 | Discharge: 2016-05-03 | Disposition: A | Discharge status: Home / Self Care | Payer: Medicare Other | Attending: Physician Assistant | Admitting: Physician Assistant

## 2016-05-03 DIAGNOSIS — R319 Hematuria, unspecified: Secondary | ICD-10-CM | POA: Diagnosis not present

## 2016-05-03 DIAGNOSIS — C50512 Malignant neoplasm of lower-outer quadrant of left female breast: Secondary | ICD-10-CM | POA: Diagnosis not present

## 2016-05-03 DIAGNOSIS — N390 Urinary tract infection, site not specified: Secondary | ICD-10-CM | POA: Diagnosis not present

## 2016-05-03 LAB — URINALYSIS, COMPLETE (UACMP) WITH MICROSCOPIC
Bilirubin Urine: NEGATIVE
GLUCOSE, UA: NEGATIVE mg/dL
KETONES UR: NEGATIVE mg/dL
Nitrite: POSITIVE — AB
PROTEIN: 100 mg/dL — AB
Specific Gravity, Urine: 1.02 (ref 1.005–1.030)
pH: 6 (ref 5.0–8.0)

## 2016-05-03 MED ORDER — CIPROFLOXACIN HCL 250 MG PO TABS: 250.0000 mg | ORAL_TABLET | Freq: Two times a day (BID) | ORAL | 0 refills | Status: AC

## 2016-05-03 NOTE — Discharge Instructions (Signed)
-  complete entire antibiotics course -Tylenol or ibuprofen for pain -Drink plenty of fluids -return to clinic or PCP should your symptoms worsen

## 2016-05-03 NOTE — ED Provider Notes (Signed)
[facility] ____________________________________________  Time seen: Approximately 8:57 AM  I have reviewed the triage vital signs and the nursing notes.   HISTORY  Chief Complaint Urinary Tract Infection   HPI Alexis Barry is a 82 y.o. female with past history as below who presented with urinary frequency and bilateral lower flank pain that started yesterday. Patient denies fever or burning sensation but does report somewhat unusual urine odor. Patient denies pain with walking.  Patient did have a UTI in February 2017 with a culture that returned with cefazolin resistant Enterobacter.  Denies chest pain, shortness of breath, abdominal pain, dysuria, extremity pain, extremity swelling or rash. Denies recent sickness. Denies recent antibiotic use.    Past Medical History:  Diagnosis Date  . Atrial fibrillation ([place])   . Breast cancer ([place]) 08/21/12   left mastectomy  . Cancer [facility])    Left breast cancer s/p left mastectomy, chemo and radiation  . CHF (congestive heart failure) ([place])   . Goiter   . Hypertension   . Mild dementia   . Personal history of chemotherapy   . Personal history of radiation therapy   -Osteoporosis  Patient Active Problem List   Diagnosis Date Noted  . Primary malignant neoplasm of lower outer quadrant of left female breast ([place]) 08/02/2014  . Chest pain 05/28/2014    Past Surgical History:  Procedure Laterality Date  . BREAST SURGERY     Left mastectomy  . MASTECTOMY Left 08/21/12   had chemo and rad  . THYROID SURGERY  1977     No current facility-administered medications for this encounter.   Current Outpatient Prescriptions:  .  alendronate (FOSAMAX) 70 MG tablet, Take 1 tablet (70 mg total) by mouth once a week. Take with a full glass of water on an empty stomach., Disp: 4 tablet, Rfl: 5 .  apixaban (ELIQUIS) 5 MG TABS tablet, Take 5 mg by mouth 2 (two) times daily., Disp: , Rfl:  .  diltiazem (CARDIZEM CD) 180 MG 24  hr capsule, Take 180 mg by mouth daily., Disp: , Rfl:  .  furosemide (LASIX) 20 MG tablet, Take 40 mg by mouth every morning., Disp: , Rfl:  .  letrozole (FEMARA) 2.5 MG tablet, Take 1 tablet (2.5 mg total) by mouth daily., Disp: 30 tablet, Rfl: 4 .  metoprolol (LOPRESSOR) 50 MG tablet, Take 50 mg by mouth 2 (two) times daily., Disp: , Rfl:  .  ciprofloxacin (CIPRO) 250 MG tablet, Take 1 tablet (250 mg total) by mouth every 12 (twelve) hours., Disp: 14 tablet, Rfl: 0  Allergies Patient has no known allergies.  Family History  Problem Relation Age of Onset  . Breast cancer Maternal Aunt   . CAD Father     Social History Social History  Substance Use Topics  . Smoking status: Never Smoker  . Smokeless tobacco: Never Used  . Alcohol use No    Review of Systems Constitutional: No fever/chills Eyes: No visual changes. ENT: No sore throat. Cardiovascular: Denies chest pain. Respiratory: Denies shortness of breath. Gastrointestinal: No abdominal pain.  No nausea, no vomiting.  No diarrhea.  No constipation. Genitourinary: Negative for dysuria or burning. Positive for frequency and bilateral lower back pain. Reports somewhat unusual urinary order. Musculoskeletal: Negative for back pain. Skin: Negative for rash. Neurological: Negative for headaches, focal weakness or numbness.  10-point ROS otherwise negative.  ____________________________________________   PHYSICAL EXAM:  VITAL SIGNS: ED Triage Vitals  Enc Vitals Group     BP  05/03/16 0848 (!) 157/86     Pulse Rate 05/03/16 0848 (!) 101     Resp 05/03/16 0848 18     Temp 05/03/16 0848 97.5 F (36.4 C)     Temp Source 05/03/16 0848 Oral     SpO2 05/03/16 0848 98 %     Weight 05/03/16 0846 163 lb (73.9 kg)     Height 05/03/16 0846 5\' 7"  (1.702 m)     Head Circumference --      Peak Flow --      Pain Score 05/03/16 0846 3     Pain Loc --      Pain Edu? --      Excl. in [place]? --     Constitutional: Alert and oriented.  Well appearing and in no acute distress. Eyes: Conjunctivae are normal. PERRL. EOMI. ENT      Head: Normocephalic and atraumatic.      Nose: No congestion/rhinnorhea.      Mouth/Throat: Mucous membranes are moist.Oropharynx non-erythematous. Neck: No stridor. Supple without meningismus.  Cardiovascular: Normal rate, regular rhythm. Grossly normal heart sounds.   Respiratory: Normal respiratory effort without tachypnea nor retractions.  Gastrointestinal: Soft. Tenderness with guarding upon palpation of lower abdomen above bladder area. No distention. Normal Bowel sounds. No CVA tenderness to palpation. Musculoskeletal:  Nontender with normal range of motion in all extremities.  Neurologic:  Normal speech and language. No gross focal neurologic deficits are appreciated. Speech is normal. No gait instability.  Skin:  Skin is warm, dry and intact. No rash noted. Psychiatric: Mood and affect are normal. Speech and behavior are normal. Patient exhibits appropriate insight and judgment   ___________________________________________   LABS (all labs ordered are listed, but only abnormal results are displayed)  Labs Reviewed  URINALYSIS, COMPLETE (UACMP) WITH MICROSCOPIC - Abnormal; Notable for the following:       Result Value   APPearance CLOUDY (*)    Hgb urine dipstick MODERATE (*)    Protein, ur 100 (*)    Nitrite POSITIVE (*)    Leukocytes, UA MODERATE (*)    Squamous Epithelial / LPF 0-5 (*)    Bacteria, UA MANY (*)    All other components within normal limits  URINE CULTURE   _____________________________________________  RADIOLOGY  No results found. ____________________________________________   PROCEDURES Procedures   Procedure(s) performed: None  ____________________________________________   FINAL CLINICAL IMPRESSION(S) / ED DIAGNOSES  Final diagnoses:  Urinary tract infection with hematuria, site unspecified  Primary malignant neoplasm of lower outer quadrant  of left female breast (HCC)    New Prescriptions   CIPROFLOXACIN (CIPRO) 250 MG TABLET    Take 1 tablet (250 mg total) by mouth every 12 (twelve) hours.   Patient presents with frequency and bilateral lower back pain that began yesterday. Urinalysis shows cloudy appearance, moderate hemoglobin, positive nitrites, moderate leukocyte esterase, many bacteria, and WBCs too numerous to count. Patient does have mild bladder area pain with palpation with some guarding. No palpable CVA tenderness but patient does report pain on both lower back areas. Given patient's history of osteoporosis and breast cancer will utilize ciprofloxacin over Bactrim. Will treat for 7 day course given her flank pain. Urine culture has been ordered we'll follow-up.  Discussed follow up with Primary care physician this week. Discussed follow up and return parameters including no resolution or any worsening concerns. Patient verbalized understanding and agreed to plan.   Note: This dictation was prepared with Dragon dictation along with smaller phrase technology. Any  transcriptional errors that result from this process are unintentional.      Luvenia Redden, PA-C     Luvenia Redden, PA-C 05/03/16 1539

## 2016-05-03 NOTE — ED Triage Notes (Addendum)
Pt c/o frequency with urination and lower back pain that started yesterday.

## 2016-05-05 LAB — URINE CULTURE

## 2016-05-09 DIAGNOSIS — I10 Essential (primary) hypertension: Secondary | ICD-10-CM | POA: Diagnosis not present

## 2016-05-09 DIAGNOSIS — I482 Chronic atrial fibrillation: Secondary | ICD-10-CM | POA: Diagnosis not present

## 2016-05-09 DIAGNOSIS — N390 Urinary tract infection, site not specified: Secondary | ICD-10-CM | POA: Diagnosis not present

## 2016-05-20 ENCOUNTER — Encounter: Payer: Self-pay | Admitting: Emergency Medicine

## 2016-05-20 ENCOUNTER — Emergency Department
Admission: EM | Admit: 2016-05-20 | Discharge: 2016-05-20 | Disposition: A | Discharge status: Home / Self Care | Payer: Medicare Other | Attending: Emergency Medicine | Admitting: Emergency Medicine

## 2016-05-20 DIAGNOSIS — Z79899 Other long term (current) drug therapy: Secondary | ICD-10-CM | POA: Diagnosis not present

## 2016-05-20 DIAGNOSIS — Z853 Personal history of malignant neoplasm of breast: Secondary | ICD-10-CM | POA: Insufficient documentation

## 2016-05-20 DIAGNOSIS — M545 Low back pain, unspecified: Secondary | ICD-10-CM

## 2016-05-20 DIAGNOSIS — I10 Essential (primary) hypertension: Secondary | ICD-10-CM | POA: Diagnosis not present

## 2016-05-20 DIAGNOSIS — N390 Urinary tract infection, site not specified: Secondary | ICD-10-CM | POA: Diagnosis not present

## 2016-05-20 LAB — BASIC METABOLIC PANEL
Anion gap: 10 (ref 5–15)
BUN: 9 mg/dL (ref 6–20)
CO2: 26 mmol/L (ref 22–32)
Calcium: 9.1 mg/dL (ref 8.9–10.3)
Chloride: 93 mmol/L — ABNORMAL LOW (ref 101–111)
Creatinine, Ser: 0.49 mg/dL (ref 0.44–1.00)
GFR calc Af Amer: >60 mL/min (ref >60)
GLUCOSE: 112 mg/dL — AB (ref 65–99)
POTASSIUM: 3.2 mmol/L — AB (ref 3.5–5.1)
Sodium: 129 mmol/L — ABNORMAL LOW (ref 135–145)

## 2016-05-20 LAB — URINALYSIS, COMPLETE (UACMP) WITH MICROSCOPIC
Bacteria, UA: NONE SEEN
Bilirubin Urine: NEGATIVE
GLUCOSE, UA: NEGATIVE mg/dL
HGB URINE DIPSTICK: NEGATIVE
KETONES UR: NEGATIVE mg/dL
LEUKOCYTES UA: NEGATIVE
Nitrite: POSITIVE — AB
PROTEIN: NEGATIVE mg/dL
Specific Gravity, Urine: 1.006 (ref 1.005–1.030)
pH: 6 (ref 5.0–8.0)

## 2016-05-20 LAB — CBC
HEMATOCRIT: 38.2 % (ref 35.0–47.0)
Hemoglobin: 13.1 g/dL (ref 12.0–16.0)
MCH: 30.7 pg (ref 26.0–34.0)
MCHC: 34.3 g/dL (ref 32.0–36.0)
MCV: 89.4 fL (ref 80.0–100.0)
Platelets: 154 10*3/uL (ref 150–440)
RBC: 4.28 MIL/uL (ref 3.80–5.20)
RDW: 13.8 % (ref 11.5–14.5)
WBC: 6.3 10*3/uL (ref 3.6–11.0)

## 2016-05-20 MED ORDER — DEXTROSE 5 % IV SOLN: 1.0000 g | Freq: Once | INTRAVENOUS | Status: DC

## 2016-05-20 MED ORDER — CEFIXIME 400 MG PO TABS: 400.0000 mg | ORAL_TABLET | Freq: Every day | ORAL | 0 refills | Status: DC

## 2016-05-20 MED ORDER — SODIUM CHLORIDE 0.9 % IV BOLUS (SEPSIS)
500.0000 mL | Freq: Once | INTRAVENOUS | Status: AC
  Administered 2016-05-20: 500 mL via INTRAVENOUS

## 2016-05-20 MED ORDER — CEFTRIAXONE SODIUM-DEXTROSE 1-3.74 GM-% IV SOLR
1.0000 g | Freq: Once | INTRAVENOUS | Status: AC
  Administered 2016-05-20: 1 g via INTRAVENOUS
  Filled 2016-05-20: qty 50

## 2016-05-20 NOTE — ED Provider Notes (Addendum)
[facility] Emergency Department Provider Note  ____________________________________________   I have reviewed the triage vital signs and the nursing notes.   HISTORY  Chief Complaint Back Pain    HPI Alexis Barry is a 82 y.o. female Who has a history of recurrent urinary tract infection She's had muiple different causative agents in the past. The most recent was a Klebsiella appears. That was sensitive to Macrobid. She was started on Cipro initially, and when she did not feel any significant improvement she went to her primary care who started her on Macrobid. Urine culture from them is pending however, we did talk to Dr.Fozia Humphrey Rolls, history is that she st about to call the patient and start her on Bactrim. She also agrees with a third generation cephalosporin. Review of patient's culture suggested this has been something to which she has been universally sensitive. In any event, patient persisted having frequency, burning with urination and low back pain. She denies a flank pain or fever. She has mild dysuriahe still feels that she has urinary tract infection. She denies any systemic symptoms aside from feeling slight nausea. She has had no cough, shortness of breath, vomiting, abdominal pain, headache,, weakness, focal weakness, she denies any numbness or weakness going down her legs and the back pain she feels the same back pain she always has with her bladder.      Past Medical History:  Diagnosis Date  . Atrial fibrillation ([place])   . Breast cancer ([place]) 08/21/12   left mastectomy  . Cancer [facility])    Left breast cancer s/p left mastectomy, chemo and radiation  . CHF (congestive heart failure) ([place])   . Goiter   . Hypertension   . Mild dementia   . Personal history of chemotherapy   . Personal history of radiation therapy     Patient Active Problem List   Diagnosis Date Noted  . Primary malignant neoplasm of lower outer quadrant of left female breast  ([place]) 08/02/2014  . Chest pain 05/28/2014    Past Surgical History:  Procedure Laterality Date  . BREAST SURGERY     Left mastectomy  . MASTECTOMY Left 08/21/12   had chemo and rad  . THYROID SURGERY  1977    Prior to Admission medications   Medication Sig Start Date End Date Taking? Authorizing Provider  alendronate (FOSAMAX) 70 MG tablet Take 1 tablet (70 mg total) by mouth once a week. Take with a full glass of water on an empty stomach. 03/15/16   Lloyd Huger, MD  apixaban (ELIQUIS) 5 MG TABS tablet Take 5 mg by mouth 2 (two) times daily.    Historical Provider, MD  diltiazem (CARDIZEM CD) 180 MG 24 hr capsule Take 180 mg by mouth daily.    Historical Provider, MD  furosemide (LASIX) 20 MG tablet Take 40 mg by mouth every morning.    Historical Provider, MD  letrozole (FEMARA) 2.5 MG tablet Take 1 tablet (2.5 mg total) by mouth daily. 04/22/16   Lloyd Huger, MD  metoprolol (LOPRESSOR) 50 MG tablet Take 50 mg by mouth 2 (two) times daily.    Historical Provider, MD    Allergies Patient has no known allergies.  Family History  Problem Relation Age of Onset  . Breast cancer Maternal Aunt   . CAD Father     Social History Social History  Substance Use Topics  . Smoking status: Never Smoker  . Smokeless tobacco: Never Used  . Alcohol use No  Review of Systems Constitutional: No fever/chills Eyes: No visual changes. ENT: No sore throat. No stiff neck no neck pain Cardiovascular: Denies chest pain. Respiratory: Denies shortness of breath. Gastrointestinal:   no vomiting.  No diarrhea.  No constipation. Genitourinary: see history of present illness. Musculoskeletal: Negative lower extremity swelling Skin: Negative for rash. Neurological: Negative for severe headaches, focal weakness or numbness. 10-point ROS otherwise negative.  ____________________________________________   PHYSICAL EXAM:  VITAL SIGNS: ED Triage Vitals  Enc Vitals Group     BP  05/20/16 1319 (!) 158/81     Pulse Rate 05/20/16 1319 82     Resp 05/20/16 1319 16     Temp --      Temp src --      SpO2 05/20/16 1319 97 %     Weight 05/20/16 1322 166 lb (75.3 kg)     Height 05/20/16 1322 5\' 7"  (1.702 m)     Head Circumference --      Peak Flow --      Pain Score 05/20/16 1319 6     Pain Loc --      Pain Edu? --      Excl. in [place]? --     Constitutional: Alert and oriented. Well appearing and in no acute distress. Eyes: Conjunctivae are normal. PERRL. EOMI. Head: Atraumatic. Nose: No congestion/rhinnorhea. Mouth/Throat: Mucous membranes are moist.  Oropharynx non-erythematous. Neck: No stridor.   Nontender with no meningismus Cardiovascular: Normal rate, regular rhythm. Grossly normal heart sounds.  Good peripheral circulation. Respiratory: Normal respiratory effort.  No retractions. Lungs CTAB. Abdominal: Soft and nontender. No distention. No guarding no rebound Back:  There is no focal tenderness or step off.  there is no midline tenderness there are no lesions noted. there is no CVA tenderness Musculoskeletal: No lower extremity tenderness, no upper extremity tenderness. No joint effusions, no DVT signs strong distal pulses no edema Neurologic:  Normal speech and language. No gross focal neurologic deficits are appreciated.  Skin:  Skin is warm, dry and intact. No rash noted. Psychiatric: Mood and affect are normal. Speech and behavior are normal.  ____________________________________________   LABS (all labs ordered are listed, but only abnormal results are displayed)  Labs Reviewed  URINALYSIS, COMPLETE (UACMP) WITH MICROSCOPIC - Abnormal; Notable for the following:       Result Value   Color, Urine AMBER (*)    APPearance CLEAR (*)    Nitrite POSITIVE (*)    Squamous Epithelial / LPF 0-5 (*)    All other components within normal limits  BASIC METABOLIC PANEL - Abnormal; Notable for the following:    Sodium 129 (*)    Potassium 3.2 (*)    Chloride  93 (*)    Glucose, Bld 112 (*)    All other components within normal limits  URINE CULTURE  CBC   ____________________________________________  EKG  I personally interpreted any EKGs ordered by me or triage  ____________________________________________  RADIOLOGY  I reviewed any imaging ordered by me or triage that were performed during my shift and, if possible, patient and/or family made aware of any abnormal findings. ____________________________________________   PROCEDURES  Procedure(s) performed: None  Procedures  Critical Care performed: None  ____________________________________________   INITIAL IMPRESSION / ASSESSMENT AND PLAN / ED COURSE  Pertinent labs & imaging results that were available during my care of the patient were reviewed by me and considered in my medical decision making (see chart for details).  She is very well-appearing she  does have nitrite positive urine. This certainly could be, given her symptoms, partially treated urinary tract infection. According to prior culture, this should be a good antibiotic however it is bacteriostatic, and patient persists in having symptoms. I discussed with her primary care doctor they would like me to give her Rocephin and third-generation home antibiotic which I think is a good idea. I have recultured her urine. She is nontoxic. She certainly has o evidence of sepsis or pyelonephritis or othersignificant pathologywith this. Nothing to suggest kidney stone. We will send her home on Suprax after discussion with the pharmacy, 200 twice a day, she will see her doctor tomorrow, return precautions and follow-up have been given and understood and patient family are very comfortable with this plan.   ----------------------------------------- 5:36 PM on 05/20/2016 -----------------------------------------  Patient is in no acute distress feels fine at this time no pain complaints, in no acute distress requesting  discharge. We will discharge her with Suprax after discussion with pharmacy. She has an appointment tomorrow morning with her doctor and will follow-up.    ____________________________________________   FINAL CLINICAL IMPRESSION(S) / ED DIAGNOSES  Final diagnoses:  None      This chart was dictated using voice recognition software.  Despite best efforts to proofread,  errors can occur which can change meaning.      Schuyler Amor, MD 05/20/16 [place], MD 05/20/16 [phone]

## 2016-05-20 NOTE — ED Notes (Signed)
Pt reports that she has a UTI - dx by urgent care 2 weeks ago (given Cipro) - went to PCP last week (kept on Cipro) - urine culture was done and atb changed to macrobid - pt continues with frequent urination, urinary pressure, and back pain

## 2016-05-20 NOTE — ED Triage Notes (Signed)
Pt to ED via POV with c/o lower back pain x2wks. Pt currently being treated for UTI with macrobid x6days with no relief. Pt c/o frequent urination, denies fever. Pt A&O4

## 2016-05-22 LAB — URINE CULTURE: Culture: NO GROWTH

## 2016-06-24 NOTE — Progress Notes (Deleted)
06/26/2016 8:03 PM   Alexis Barry Nov 24, 1934 [id number]  Referring provider: Lavera Guise, [address], [place] [address]  No chief complaint on file.   HPI: Patient is a 82 -year-old Caucasian female who is referred to [place] by, Dr. Clayborn Bigness, for recurrent urinary tract infections.  Patient states that she has had *** urinary tract infections over the last year.  Reviewing her records,  she has had one documented positive UTI.    Her symptoms with a urinary tract infection consist of ***.  She denies/endorses dysuria, gross hematuria, suprapubic pain, back pain, abdominal pain or flank pain.***  She denies/endorses dysuria, gross hematuria, suprapubic pain, back pain, abdominal pain or flank pain.***  She has not had any recent fevers, chills, nausea or vomiting. ***  She has not had any recent fevers, chills, nausea or vomiting. ***  She does/does not have a history of nephrolithiasis, GU surgery or GU trauma. ***  She does/does not have a history of nephrolithiasis, GU surgery or GU trauma. ***  She is/is not sexually active.  She has/has not noted a correlation with her urinary tract infections and sexual intercourse.  ***   She does/does not engage in anal sex. ***  She is/ is not having anal to vaginal sex.*** She is/is not voiding before and after sex. ***     She is postmenopausal. ***  She admits to/denies constipation and/or diarrhea. ***  She does/does not engage in good perineal hygiene. She does/does not take tub baths. ***  She has/does not have incontinence.  She is using incontinence pads. ***  She is having/ not having pain with bladder filling.  ***  She has not had any recent imaging studies.    She is drinking *** of water daily.       PMH: Past Medical History:  Diagnosis Date  . Atrial fibrillation ([place])   . Breast cancer ([place]) 08/21/12   left mastectomy  . Cancer [facility])    Left breast cancer s/p left mastectomy,  chemo and radiation  . CHF (congestive heart failure) ([place])   . Goiter   . Hypertension   . Mild dementia   . Personal history of chemotherapy   . Personal history of radiation therapy     Surgical History: Past Surgical History:  Procedure Laterality Date  . BREAST SURGERY     Left mastectomy  . MASTECTOMY Left 08/21/12   had chemo and rad  . THYROID SURGERY  1977    Home Medications:  Allergies as of 06/26/2016   No Known Allergies     Medication List       Accurate as of 06/24/16  8:03 PM. Always use your most recent med list.          acetaminophen 500 MG tablet Commonly known as:  TYLENOL Take 500 mg by mouth every 6 (six) hours as needed.   alendronate 70 MG tablet Commonly known as:  FOSAMAX Take 1 tablet (70 mg total) by mouth once a week. Take with a full glass of water on an empty stomach.   apixaban 5 MG Tabs tablet Commonly known as:  ELIQUIS Take 5 mg by mouth 2 (two) times daily.   cefixime 400 MG tablet Commonly known as:  SUPRAX Take 1 tablet (400 mg total) by mouth daily.   diltiazem 180 MG 24 hr capsule Commonly known as:  CARDIZEM CD Take 180 mg by mouth daily.   furosemide 20 MG tablet  Commonly known as:  LASIX Take 40 mg by mouth every morning.   letrozole 2.5 MG tablet Commonly known as:  FEMARA Take 1 tablet (2.5 mg total) by mouth daily.   metoprolol tartrate 50 MG tablet Commonly known as:  LOPRESSOR Take 50 mg by mouth 2 (two) times daily.   nitrofurantoin 100 MG capsule Commonly known as:  MACRODANTIN Take 100 mg by mouth 2 (two) times daily.   rosuvastatin 10 MG tablet Commonly known as:  CRESTOR Take 10 mg by mouth daily.       Allergies: No Known Allergies  Family History: Family History  Problem Relation Age of Onset  . Breast cancer Maternal Aunt   . CAD Father     Social History:  reports that she has never smoked. She has never used smokeless tobacco. She reports that she does not drink alcohol or use  drugs.  ROS:                                        Physical Exam: There were no vitals taken for this visit.  Constitutional: Well nourished. Alert and oriented, No acute distress. HEENT: [place] AT, moist mucus membranes. Trachea midline, no masses. Cardiovascular: No clubbing, cyanosis, or edema. Respiratory: Normal respiratory effort, no increased work of breathing. GI: Abdomen is soft, non tender, non distended, no abdominal masses. Liver and spleen not palpable.  No hernias appreciated.  Stool sample for occult testing is not indicated.   GU: No CVA tenderness.  No bladder fullness or masses.  Normal external genitalia, normal pubic hair distribution, no lesions.  Normal urethral meatus, no lesions, no prolapse, no discharge.   No urethral masses, tenderness and/or tenderness. No bladder fullness, tenderness or masses. Normal vagina mucosa, good estrogen effect, no discharge, no lesions, good pelvic support, no cystocele or rectocele noted.  No cervical motion tenderness.  Uterus is freely mobile and non-fixed.  No adnexal/parametria masses or tenderness noted.  Anus and perineum are without rashes or lesions.   *** Skin: No rashes, bruises or suspicious lesions. Lymph: No cervical or inguinal adenopathy. Neurologic: Grossly intact, no focal deficits, moving all 4 extremities. Psychiatric: Normal mood and affect.  Laboratory Data: Lab Results  Component Value Date   WBC 6.3 05/20/2016   HGB 13.1 05/20/2016   HCT 38.2 05/20/2016   MCV 89.4 05/20/2016   PLT 154 05/20/2016    Lab Results  Component Value Date   CREATININE 0.49 05/20/2016    Lab Results  Component Value Date   HGBA1C 5.5 12/09/2011    Lab Results  Component Value Date   TSH 2.11 12/08/2011       Component Value Date/Time   CHOL 166 12/09/2011 0659   HDL 52 12/09/2011 0659   VLDL 17 12/09/2011 0659   LDLCALC 97 12/09/2011 0659    Lab Results  Component Value Date   AST 17  10/01/2012   Lab Results  Component Value Date   ALT 12 10/01/2012    Urinalysis ***  Pertinent Imaging: ***  Assessment & Plan:  ***   - criteria for recurrent UTI has been met with 2 or more infections in 6 months or 3 or greater infections in one year ***  - Patient is instructed to increase their water intake until the urine is pale yellow or clear (10 to 12 cups daily) ***  - probiotics (yogurt, oral pills  or vaginal suppositories), take cranberry pills or drink the juice and Vitamin C 1,000 mg daily to acidify the urine should be added to their daily regimen ***  -. if using tampons, she should remove them prior to urinating and change them often ***  -avoid soaking in tubs and wipe front to back after urinating ***  - benefit from core strengthening exercises has been seen.  We can refer her to PT if they desire ***  - advised them to have CATH UA's for urinalysis and culture to prevent skin contamination of the specimen  - reviewed symptoms of UTI and advised not to have urine checked or be treated for UTI if not experiencing symptoms  - discussed antibiotic stewardship with the patient     2. Vaginal atrophy                                              No Follow-up on file.  These notes generated with voice recognition software. I apologize for typographical errors.  Zara Council, [facility] [address] [place],  [address] [phone]

## 2016-06-26 ENCOUNTER — Ambulatory Visit: Payer: Self-pay | Admitting: Urology

## 2016-08-19 DIAGNOSIS — N390 Urinary tract infection, site not specified: Secondary | ICD-10-CM | POA: Diagnosis not present

## 2016-08-19 DIAGNOSIS — R300 Dysuria: Secondary | ICD-10-CM | POA: Diagnosis not present

## 2016-08-19 DIAGNOSIS — F5102 Adjustment insomnia: Secondary | ICD-10-CM | POA: Diagnosis not present

## 2016-09-16 ENCOUNTER — Other Ambulatory Visit: Payer: Self-pay | Admitting: Oncology

## 2016-09-16 DIAGNOSIS — C50512 Malignant neoplasm of lower-outer quadrant of left female breast: Secondary | ICD-10-CM

## 2016-09-18 ENCOUNTER — Other Ambulatory Visit: Payer: Self-pay | Admitting: Oncology

## 2016-09-19 DIAGNOSIS — R600 Localized edema: Secondary | ICD-10-CM | POA: Diagnosis not present

## 2016-09-19 DIAGNOSIS — I482 Chronic atrial fibrillation: Secondary | ICD-10-CM | POA: Diagnosis not present

## 2016-09-19 DIAGNOSIS — I340 Nonrheumatic mitral (valve) insufficiency: Secondary | ICD-10-CM | POA: Diagnosis not present

## 2016-09-19 DIAGNOSIS — I10 Essential (primary) hypertension: Secondary | ICD-10-CM | POA: Diagnosis not present

## 2016-09-30 ENCOUNTER — Other Ambulatory Visit: Payer: Medicare Other

## 2016-10-02 NOTE — Progress Notes (Signed)
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  INTERVAL HISTORY: Patient returns to clinic today for routine 6 month evaluation.  She continues to feel well and is asymptomatic.  She is tolerating letrozole and Fosamax without significant side effects. She has no neurologic complaints. She denies any recent fevers or illnesses. She does not complain of peripheral neuropathy today.  She does not complain of chest pain or shortness of breath. She denies any nausea, vomiting, constipation, or diarrhea. She has no urinary complaints. Patient offers no specific complaints today.  REVIEW OF SYSTEMS:   Review of Systems  Constitutional: Negative.  Negative for fever, malaise/fatigue and weight loss.  Respiratory: Negative.  Negative for cough and shortness of breath.   Cardiovascular: Negative.  Negative for chest pain and leg swelling.  Gastrointestinal: Negative.  Negative for abdominal pain.  Genitourinary: Negative.   Musculoskeletal: Negative.   Skin: Negative.  Negative for rash.  Neurological: Negative.  Negative for sensory change and weakness.  Psychiatric/Behavioral: Negative.  The patient is not nervous/anxious.     As per HPI. Otherwise, a complete review of systems is negative.  PAST MEDICAL HISTORY: Past Medical History:  Diagnosis Date  . Atrial fibrillation ([place])   . Breast cancer ([place]) 08/21/12   left mastectomy  . Cancer [facility])    Left breast cancer s/p left mastectomy, chemo and radiation  . CHF (congestive heart failure) ([place])   . Goiter   . Hypertension   . Mild dementia   . Personal history of chemotherapy   . Personal history of radiation therapy     PAST  SURGICAL HISTORY: Past Surgical History:  Procedure Laterality Date  . BREAST SURGERY     Left mastectomy  . MASTECTOMY Left 08/21/12   had chemo and rad  . THYROID SURGERY  1977    FAMILY HISTORY Family History  Problem Relation Age of Onset  . Breast cancer Maternal Aunt   . CAD Father        ADVANCED DIRECTIVES:    HEALTH MAINTENANCE: Social History  Substance Use Topics  . Smoking status: Never Smoker  . Smokeless tobacco: Never Used  . Alcohol use No     Colonoscopy:  PAP:  Bone density:  Lipid panel:  No Known Allergies  Current Outpatient Prescriptions  Medication Sig Dispense Refill  . acetaminophen (TYLENOL) 500 MG tablet Take 500 mg by mouth every 6 (six) hours as needed.    Marland Kitchen alendronate (FOSAMAX) 70 MG tablet TAKE 1 TABLET(70 MG) BY MOUTH 1 TIME A WEEK WITH A FULL GLASS OF WATER AND ON AN EMPTY STOMACH 4 tablet 0  . apixaban (ELIQUIS) 5 MG TABS tablet Take 5 mg by mouth 2 (two) times daily.    . cefixime (SUPRAX) 400 MG tablet Take 1 tablet (400 mg total) by mouth daily. 10 tablet 0  . diltiazem (CARDIZEM CD) 180 MG 24 hr capsule Take 180 mg by mouth daily.    . furosemide (LASIX) 20 MG tablet Take 40 mg by mouth every morning.    Marland Kitchen letrozole (FEMARA) 2.5 MG tablet TAKE 1 TABLET(2.5 MG) BY MOUTH DAILY 30 tablet 0  . metoprolol (LOPRESSOR) 50 MG tablet Take 50 mg by mouth 2 (two) times  daily.    . rosuvastatin (CRESTOR) 10 MG tablet Take 10 mg by mouth daily.    . nitrofurantoin (MACRODANTIN) 100 MG capsule Take 100 mg by mouth 2 (two) times daily.     No current facility-administered medications for this visit.     OBJECTIVE: Vitals:   10/04/16 1020  BP: (!) 164/78  Resp: 18  Temp: 97.6 F (36.4 C)     Body mass index is 25.26 kg/m.    ECOG FS:0 - Asymptomatic  General: Well-developed, well-nourished, no acute distress. Eyes: anicteric sclera. Breasts: Left mastectomy, patient requested exam be deferred today. Lungs: Clear to auscultation  bilaterally. Heart: Regular rate and rhythm. No rubs, murmurs, or gallops. Abdomen: Soft, nontender, nondistended. No organomegaly noted, normoactive bowel sounds. Musculoskeletal: No edema, cyanosis, or clubbing. Neuro: Alert, answering all questions appropriately. Cranial nerves grossly intact. Skin: No rashes or petechiae noted. Psych: Normal affect.  LAB RESULTS:  Lab Results  Component Value Date   NA 129 (L) 05/20/2016   K 3.2 (L) 05/20/2016   CL 93 (L) 05/20/2016   CO2 26 05/20/2016   GLUCOSE 112 (H) 05/20/2016   BUN 9 05/20/2016   CREATININE 0.49 05/20/2016   CALCIUM 9.1 05/20/2016   PROT 6.5 10/01/2012   ALBUMIN 3.6 10/01/2012   AST 17 10/01/2012   ALT 12 10/01/2012   ALKPHOS 94 10/01/2012   BILITOT 0.3 10/01/2012   GFRNONAA >60 05/20/2016   GFRAA >60 05/20/2016    Lab Results  Component Value Date   WBC 6.3 05/20/2016   NEUTROABS 3.3 12/11/2012   HGB 13.1 05/20/2016   HCT 38.2 05/20/2016   MCV 89.4 05/20/2016   PLT 154 05/20/2016   Lab Results  Component Value Date   LABCA2 34.0 03/29/2016     STUDIES: No results found.  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  PLAN:    1.  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  Given the ER/PR status of her tumor, she will take letrozole for a total of 5 years completing in December 2019. CA-27-29 continues to be within normal limits. Her most recent mammogram on April 24, 2016 was reported as BI-RADS 1. Repeat in April 2019. Return to clinic in 7 months, after her next mammogram, for routine evaluation. 2. Osteoporosis: Bone mineral density on September 28, 2015 revealed a T score of -3.3 which is improved over her previous scan.  Continue Fosamax, calcium, and vitamin D. Repeat  in the next 1-2 weeks.  3.  Thyroid nodule: Unclear etiology.  Continue to monitor and if nodule increases in size we will refer her to ENT for biopsy.   Patient expressed understanding and was in agreement with this plan. She also understands that She can call clinic at any time with any questions, concerns, or complaints.   Breast cancer   Staging form: Breast, AJCC 7th Edition     Clinical stage from 08/02/2014: Stage IIIB (T4, N0, M0) - Signed by Lloyd Huger, MD on 08/02/2014   Lloyd Huger, MD   10/04/2016 4:15 PM

## 2016-10-04 ENCOUNTER — Inpatient Hospital Stay: Payer: Medicare Other | Attending: Oncology | Admitting: Oncology

## 2016-10-04 VITALS — BP 164/78 | Temp 97.6°F | Resp 18 | Wt 161.3 lb

## 2016-10-04 DIAGNOSIS — Z170 Estrogen receptor positive status [ER+]: Secondary | ICD-10-CM | POA: Diagnosis not present

## 2016-10-04 DIAGNOSIS — I110 Hypertensive heart disease with heart failure: Secondary | ICD-10-CM | POA: Diagnosis not present

## 2016-10-04 DIAGNOSIS — M818 Other osteoporosis without current pathological fracture: Secondary | ICD-10-CM | POA: Diagnosis not present

## 2016-10-04 DIAGNOSIS — I509 Heart failure, unspecified: Secondary | ICD-10-CM | POA: Insufficient documentation

## 2016-10-04 DIAGNOSIS — Z79811 Long term (current) use of aromatase inhibitors: Secondary | ICD-10-CM | POA: Insufficient documentation

## 2016-10-04 DIAGNOSIS — Z9221 Personal history of antineoplastic chemotherapy: Secondary | ICD-10-CM | POA: Insufficient documentation

## 2016-10-04 DIAGNOSIS — Z923 Personal history of irradiation: Secondary | ICD-10-CM | POA: Insufficient documentation

## 2016-10-04 DIAGNOSIS — Z9012 Acquired absence of left breast and nipple: Secondary | ICD-10-CM | POA: Diagnosis not present

## 2016-10-04 DIAGNOSIS — E041 Nontoxic single thyroid nodule: Secondary | ICD-10-CM | POA: Diagnosis not present

## 2016-10-04 DIAGNOSIS — Z79899 Other long term (current) drug therapy: Secondary | ICD-10-CM

## 2016-10-04 DIAGNOSIS — F0390 Unspecified dementia without behavioral disturbance: Secondary | ICD-10-CM | POA: Diagnosis not present

## 2016-10-04 DIAGNOSIS — C50512 Malignant neoplasm of lower-outer quadrant of left female breast: Secondary | ICD-10-CM | POA: Insufficient documentation

## 2016-10-04 DIAGNOSIS — I4891 Unspecified atrial fibrillation: Secondary | ICD-10-CM | POA: Insufficient documentation

## 2016-10-15 ENCOUNTER — Other Ambulatory Visit: Payer: Self-pay | Admitting: Oncology

## 2016-10-15 DIAGNOSIS — C50512 Malignant neoplasm of lower-outer quadrant of left female breast: Secondary | ICD-10-CM

## 2016-10-17 ENCOUNTER — Other Ambulatory Visit: Payer: Self-pay | Admitting: Oncology

## 2016-11-13 ENCOUNTER — Other Ambulatory Visit: Payer: Self-pay | Admitting: Oncology

## 2016-11-13 DIAGNOSIS — C50512 Malignant neoplasm of lower-outer quadrant of left female breast: Secondary | ICD-10-CM

## 2016-11-15 ENCOUNTER — Other Ambulatory Visit: Payer: Self-pay | Admitting: Oncology

## 2016-11-18 ENCOUNTER — Other Ambulatory Visit: Payer: Self-pay | Admitting: *Deleted

## 2016-11-18 DIAGNOSIS — C50919 Malignant neoplasm of unspecified site of unspecified female breast: Secondary | ICD-10-CM

## 2016-12-10 ENCOUNTER — Other Ambulatory Visit: Payer: Self-pay | Admitting: Oncology

## 2016-12-10 DIAGNOSIS — C50512 Malignant neoplasm of lower-outer quadrant of left female breast: Secondary | ICD-10-CM

## 2016-12-19 ENCOUNTER — Other Ambulatory Visit: Payer: Self-pay | Admitting: Oncology

## 2016-12-24 ENCOUNTER — Other Ambulatory Visit: Payer: Medicare Other

## 2017-01-20 ENCOUNTER — Other Ambulatory Visit: Payer: Self-pay | Admitting: Oncology

## 2017-01-28 ENCOUNTER — Encounter: Payer: Self-pay | Admitting: Internal Medicine

## 2017-01-28 DIAGNOSIS — N390 Urinary tract infection, site not specified: Secondary | ICD-10-CM | POA: Insufficient documentation

## 2017-01-28 DIAGNOSIS — M545 Low back pain, unspecified: Secondary | ICD-10-CM | POA: Insufficient documentation

## 2017-02-01 ENCOUNTER — Emergency Department: Payer: Medicare Other

## 2017-02-01 ENCOUNTER — Inpatient Hospital Stay
Admission: EM | Admit: 2017-02-01 | Discharge: 2017-02-06 | DRG: 441 | Disposition: A | Discharge status: Hospice | Payer: Medicare Other | Attending: Family Medicine | Admitting: Family Medicine

## 2017-02-01 ENCOUNTER — Encounter: Payer: Self-pay | Admitting: Medical Oncology

## 2017-02-01 ENCOUNTER — Other Ambulatory Visit: Payer: Self-pay

## 2017-02-01 ENCOUNTER — Ambulatory Visit (INDEPENDENT_AMBULATORY_CARE_PROVIDER_SITE_OTHER)
Admission: EM | Admit: 2017-02-01 | Discharge: 2017-02-01 | Disposition: A | Discharge status: Other Acute Inpatient Hospital | Payer: Medicare Other | Source: Home / Self Care | Attending: Family Medicine | Admitting: Family Medicine

## 2017-02-01 DIAGNOSIS — N170 Acute kidney failure with tubular necrosis: Secondary | ICD-10-CM | POA: Diagnosis not present

## 2017-02-01 DIAGNOSIS — Z923 Personal history of irradiation: Secondary | ICD-10-CM

## 2017-02-01 DIAGNOSIS — Z66 Do not resuscitate: Secondary | ICD-10-CM | POA: Diagnosis not present

## 2017-02-01 DIAGNOSIS — K7200 Acute and subacute hepatic failure without coma: Secondary | ICD-10-CM | POA: Diagnosis not present

## 2017-02-01 DIAGNOSIS — D689 Coagulation defect, unspecified: Secondary | ICD-10-CM | POA: Diagnosis not present

## 2017-02-01 DIAGNOSIS — Z515 Encounter for palliative care: Secondary | ICD-10-CM | POA: Diagnosis not present

## 2017-02-01 DIAGNOSIS — E878 Other disorders of electrolyte and fluid balance, not elsewhere classified: Secondary | ICD-10-CM | POA: Diagnosis present

## 2017-02-01 DIAGNOSIS — E8809 Other disorders of plasma-protein metabolism, not elsewhere classified: Secondary | ICD-10-CM | POA: Diagnosis present

## 2017-02-01 DIAGNOSIS — Z9012 Acquired absence of left breast and nipple: Secondary | ICD-10-CM | POA: Diagnosis not present

## 2017-02-01 DIAGNOSIS — A419 Sepsis, unspecified organism: Secondary | ICD-10-CM | POA: Diagnosis not present

## 2017-02-01 DIAGNOSIS — I959 Hypotension, unspecified: Secondary | ICD-10-CM

## 2017-02-01 DIAGNOSIS — R6520 Severe sepsis without septic shock: Secondary | ICD-10-CM | POA: Diagnosis not present

## 2017-02-01 DIAGNOSIS — R1115 Cyclical vomiting syndrome unrelated to migraine: Secondary | ICD-10-CM

## 2017-02-01 DIAGNOSIS — E876 Hypokalemia: Secondary | ICD-10-CM | POA: Diagnosis present

## 2017-02-01 DIAGNOSIS — F0390 Unspecified dementia without behavioral disturbance: Secondary | ICD-10-CM | POA: Diagnosis present

## 2017-02-01 DIAGNOSIS — K7460 Unspecified cirrhosis of liver: Secondary | ICD-10-CM | POA: Diagnosis present

## 2017-02-01 DIAGNOSIS — N390 Urinary tract infection, site not specified: Secondary | ICD-10-CM | POA: Diagnosis not present

## 2017-02-01 DIAGNOSIS — K828 Other specified diseases of gallbladder: Secondary | ICD-10-CM | POA: Diagnosis present

## 2017-02-01 DIAGNOSIS — E782 Mixed hyperlipidemia: Secondary | ICD-10-CM | POA: Diagnosis present

## 2017-02-01 DIAGNOSIS — Z9221 Personal history of antineoplastic chemotherapy: Secondary | ICD-10-CM | POA: Diagnosis not present

## 2017-02-01 DIAGNOSIS — R1110 Vomiting, unspecified: Secondary | ICD-10-CM | POA: Diagnosis not present

## 2017-02-01 DIAGNOSIS — R945 Abnormal results of liver function studies: Secondary | ICD-10-CM | POA: Diagnosis not present

## 2017-02-01 DIAGNOSIS — E860 Dehydration: Secondary | ICD-10-CM | POA: Diagnosis not present

## 2017-02-01 DIAGNOSIS — R001 Bradycardia, unspecified: Secondary | ICD-10-CM | POA: Diagnosis not present

## 2017-02-01 DIAGNOSIS — R601 Generalized edema: Secondary | ICD-10-CM | POA: Diagnosis not present

## 2017-02-01 DIAGNOSIS — Z7901 Long term (current) use of anticoagulants: Secondary | ICD-10-CM | POA: Diagnosis not present

## 2017-02-01 DIAGNOSIS — I482 Chronic atrial fibrillation: Secondary | ICD-10-CM | POA: Diagnosis present

## 2017-02-01 DIAGNOSIS — I5032 Chronic diastolic (congestive) heart failure: Secondary | ICD-10-CM | POA: Diagnosis present

## 2017-02-01 DIAGNOSIS — K7689 Other specified diseases of liver: Secondary | ICD-10-CM | POA: Diagnosis not present

## 2017-02-01 DIAGNOSIS — R112 Nausea with vomiting, unspecified: Secondary | ICD-10-CM | POA: Diagnosis not present

## 2017-02-01 DIAGNOSIS — N179 Acute kidney failure, unspecified: Secondary | ICD-10-CM

## 2017-02-01 DIAGNOSIS — R932 Abnormal findings on diagnostic imaging of liver and biliary tract: Secondary | ICD-10-CM | POA: Diagnosis not present

## 2017-02-01 DIAGNOSIS — M6281 Muscle weakness (generalized): Secondary | ICD-10-CM | POA: Diagnosis not present

## 2017-02-01 DIAGNOSIS — E871 Hypo-osmolality and hyponatremia: Secondary | ICD-10-CM | POA: Diagnosis not present

## 2017-02-01 DIAGNOSIS — Z79899 Other long term (current) drug therapy: Secondary | ICD-10-CM

## 2017-02-01 DIAGNOSIS — N183 Chronic kidney disease, stage 3 (moderate): Secondary | ICD-10-CM | POA: Diagnosis present

## 2017-02-01 DIAGNOSIS — E872 Acidosis: Secondary | ICD-10-CM | POA: Diagnosis present

## 2017-02-01 DIAGNOSIS — K8020 Calculus of gallbladder without cholecystitis without obstruction: Secondary | ICD-10-CM | POA: Diagnosis not present

## 2017-02-01 DIAGNOSIS — K529 Noninfective gastroenteritis and colitis, unspecified: Secondary | ICD-10-CM | POA: Diagnosis not present

## 2017-02-01 DIAGNOSIS — Z853 Personal history of malignant neoplasm of breast: Secondary | ICD-10-CM

## 2017-02-01 DIAGNOSIS — I130 Hypertensive heart and chronic kidney disease with heart failure and stage 1 through stage 4 chronic kidney disease, or unspecified chronic kidney disease: Secondary | ICD-10-CM | POA: Diagnosis present

## 2017-02-01 DIAGNOSIS — K7290 Hepatic failure, unspecified without coma: Secondary | ICD-10-CM | POA: Diagnosis not present

## 2017-02-01 DIAGNOSIS — R7989 Other specified abnormal findings of blood chemistry: Secondary | ICD-10-CM | POA: Diagnosis not present

## 2017-02-01 DIAGNOSIS — I820 Budd-Chiari syndrome: Secondary | ICD-10-CM | POA: Diagnosis not present

## 2017-02-01 DIAGNOSIS — R05 Cough: Secondary | ICD-10-CM

## 2017-02-01 DIAGNOSIS — R778 Other specified abnormalities of plasma proteins: Secondary | ICD-10-CM

## 2017-02-01 DIAGNOSIS — E049 Nontoxic goiter, unspecified: Secondary | ICD-10-CM | POA: Diagnosis present

## 2017-02-01 DIAGNOSIS — D696 Thrombocytopenia, unspecified: Secondary | ICD-10-CM | POA: Diagnosis present

## 2017-02-01 DIAGNOSIS — I480 Paroxysmal atrial fibrillation: Secondary | ICD-10-CM | POA: Diagnosis not present

## 2017-02-01 DIAGNOSIS — I10 Essential (primary) hypertension: Secondary | ICD-10-CM | POA: Diagnosis not present

## 2017-02-01 DIAGNOSIS — I272 Pulmonary hypertension, unspecified: Secondary | ICD-10-CM | POA: Diagnosis present

## 2017-02-01 DIAGNOSIS — K7469 Other cirrhosis of liver: Secondary | ICD-10-CM | POA: Diagnosis not present

## 2017-02-01 DIAGNOSIS — N289 Disorder of kidney and ureter, unspecified: Secondary | ICD-10-CM

## 2017-02-01 DIAGNOSIS — Z7189 Other specified counseling: Secondary | ICD-10-CM | POA: Diagnosis not present

## 2017-02-01 DIAGNOSIS — I081 Rheumatic disorders of both mitral and tricuspid valves: Secondary | ICD-10-CM | POA: Diagnosis present

## 2017-02-01 LAB — CBC WITH DIFFERENTIAL/PLATELET
BASOS PCT: 1 %
Basophils Absolute: 0.1 10*3/uL (ref 0–0.1)
EOS ABS: 0 10*3/uL (ref 0–0.7)
EOS PCT: 0 %
HCT: 42.5 % (ref 35.0–47.0)
Hemoglobin: 14 g/dL (ref 12.0–16.0)
Lymphocytes Relative: 5 %
Lymphs Abs: 0.6 10*3/uL — ABNORMAL LOW (ref 1.0–3.6)
MCH: 29.6 pg (ref 26.0–34.0)
MCHC: 33 g/dL (ref 32.0–36.0)
MCV: 89.6 fL (ref 80.0–100.0)
MONOS PCT: 7 %
Monocytes Absolute: 0.8 10*3/uL (ref 0.2–0.9)
Neutro Abs: 9.8 10*3/uL — ABNORMAL HIGH (ref 1.4–6.5)
Neutrophils Relative %: 87 %
PLATELETS: 114 10*3/uL — AB (ref 150–440)
RBC: 4.74 MIL/uL (ref 3.80–5.20)
RDW: 15.5 % — ABNORMAL HIGH (ref 11.5–14.5)
WBC: 11.2 10*3/uL — ABNORMAL HIGH (ref 3.6–11.0)

## 2017-02-01 LAB — TROPONIN I
TROPONIN I: 0.06 ng/mL — AB (ref <0.03)
Troponin I: 0.08 ng/mL (ref <0.03)

## 2017-02-01 LAB — INFLUENZA PANEL BY PCR (TYPE A & B)
Influenza A By PCR: NEGATIVE
Influenza B By PCR: NEGATIVE

## 2017-02-01 LAB — URINALYSIS, COMPLETE (UACMP) WITH MICROSCOPIC
Bilirubin Urine: NEGATIVE
Glucose, UA: NEGATIVE mg/dL
Ketones, ur: NEGATIVE mg/dL
NITRITE: NEGATIVE
Protein, ur: 100 mg/dL — AB
Specific Gravity, Urine: 1.023 (ref 1.005–1.030)
pH: 5 (ref 5.0–8.0)

## 2017-02-01 LAB — BILIRUBIN, DIRECT: BILIRUBIN DIRECT: 2.8 mg/dL — AB (ref 0.1–0.5)

## 2017-02-01 LAB — COMPREHENSIVE METABOLIC PANEL
ALK PHOS: 276 U/L — AB (ref 38–126)
ALT: 47 U/L (ref 14–54)
AST: 242 U/L — ABNORMAL HIGH (ref 15–41)
Albumin: 2.4 g/dL — ABNORMAL LOW (ref 3.5–5.0)
Anion gap: 11 (ref 5–15)
BUN: 18 mg/dL (ref 6–20)
CALCIUM: 7.1 mg/dL — AB (ref 8.9–10.3)
CO2: 26 mmol/L (ref 22–32)
Chloride: 84 mmol/L — ABNORMAL LOW (ref 101–111)
Creatinine, Ser: 1.71 mg/dL — ABNORMAL HIGH (ref 0.44–1.00)
GFR calc non Af Amer: 27 mL/min — ABNORMAL LOW (ref >60)
GFR, EST AFRICAN AMERICAN: 31 mL/min — AB (ref >60)
Glucose, Bld: 182 mg/dL — ABNORMAL HIGH (ref 65–99)
Potassium: 2.4 mmol/L — CL (ref 3.5–5.1)
SODIUM: 121 mmol/L — AB (ref 135–145)
TOTAL PROTEIN: 4.5 g/dL — AB (ref 6.5–8.1)
Total Bilirubin: 5.3 mg/dL — ABNORMAL HIGH (ref 0.3–1.2)

## 2017-02-01 LAB — CREATININE, SERUM
Creatinine, Ser: 1.56 mg/dL — ABNORMAL HIGH (ref 0.44–1.00)
GFR, EST AFRICAN AMERICAN: 35 mL/min — AB (ref >60)
GFR, EST NON AFRICAN AMERICAN: 30 mL/min — AB (ref >60)

## 2017-02-01 LAB — PROCALCITONIN: Procalcitonin: 0.88 ng/mL

## 2017-02-01 LAB — PROTIME-INR
INR: 4.05 — AB
Prothrombin Time: 38.8 seconds — ABNORMAL HIGH (ref 11.4–15.2)

## 2017-02-01 LAB — LACTIC ACID, PLASMA
LACTIC ACID, VENOUS: 4.6 mmol/L — AB (ref 0.5–1.9)
Lactic Acid, Venous: 5.2 mmol/L (ref 0.5–1.9)

## 2017-02-01 LAB — APTT: aPTT: 48 seconds — ABNORMAL HIGH (ref 24–36)

## 2017-02-01 LAB — MAGNESIUM: Magnesium: 2 mg/dL (ref 1.7–2.4)

## 2017-02-01 MED ORDER — SENNOSIDES-DOCUSATE SODIUM 8.6-50 MG PO TABS: 1.0000 | ORAL_TABLET | Freq: Every evening | ORAL | Status: DC | PRN

## 2017-02-01 MED ORDER — ACETAMINOPHEN 325 MG PO TABS: 650.0000 mg | ORAL_TABLET | Freq: Four times a day (QID) | ORAL | Status: DC | PRN

## 2017-02-01 MED ORDER — POTASSIUM CHLORIDE 10 MEQ/100ML IV SOLN
10.0000 meq | INTRAVENOUS | Status: AC
  Administered 2017-02-01: 10 meq via INTRAVENOUS
  Filled 2017-02-01 (×3): qty 100

## 2017-02-01 MED ORDER — ONDANSETRON HCL 4 MG/2ML IJ SOLN
4.0000 mg | Freq: Four times a day (QID) | INTRAMUSCULAR | Status: DC | PRN
  Administered 2017-02-01 – 2017-02-05 (×4): 4 mg via INTRAVENOUS
  Filled 2017-02-01 (×4): qty 2

## 2017-02-01 MED ORDER — ONDANSETRON HCL 4 MG PO TABS
4.0000 mg | ORAL_TABLET | Freq: Four times a day (QID) | ORAL | Status: DC | PRN
  Filled 2017-02-01: qty 1

## 2017-02-01 MED ORDER — POTASSIUM CHLORIDE CRYS ER 20 MEQ PO TBCR
40.0000 meq | EXTENDED_RELEASE_TABLET | Freq: Once | ORAL | Status: AC
  Administered 2017-02-01: 40 meq via ORAL
  Filled 2017-02-01: qty 2

## 2017-02-01 MED ORDER — ALBUTEROL SULFATE (2.5 MG/3ML) 0.083% IN NEBU: 2.5000 mg | INHALATION_SOLUTION | RESPIRATORY_TRACT | Status: DC | PRN

## 2017-02-01 MED ORDER — VANCOMYCIN HCL IN DEXTROSE 750-5 MG/150ML-% IV SOLN
750.0000 mg | INTRAVENOUS | Status: DC
  Administered 2017-02-02 – 2017-02-03 (×2): 750 mg via INTRAVENOUS
  Filled 2017-02-01 (×2): qty 150

## 2017-02-01 MED ORDER — APIXABAN 2.5 MG PO TABS
2.5000 mg | ORAL_TABLET | Freq: Two times a day (BID) | ORAL | Status: DC
  Administered 2017-02-01 – 2017-02-04 (×6): 2.5 mg via ORAL
  Filled 2017-02-01 (×6): qty 1

## 2017-02-01 MED ORDER — ACETAMINOPHEN 650 MG RE SUPP: 650.0000 mg | Freq: Four times a day (QID) | RECTAL | Status: DC | PRN

## 2017-02-01 MED ORDER — SODIUM CHLORIDE 0.9 % IV BOLUS (SEPSIS)
1000.0000 mL | Freq: Once | INTRAVENOUS | Status: AC
  Administered 2017-02-01: 1000 mL via INTRAVENOUS

## 2017-02-01 MED ORDER — PIPERACILLIN-TAZOBACTAM 3.375 G IVPB 30 MIN
3.3750 g | Freq: Once | INTRAVENOUS | Status: AC
  Administered 2017-02-01: 3.375 g via INTRAVENOUS
  Filled 2017-02-01: qty 50

## 2017-02-01 MED ORDER — SODIUM CHLORIDE 0.9 % IV BOLUS (SEPSIS)
500.0000 mL | Freq: Once | INTRAVENOUS | Status: AC
  Administered 2017-02-01: 500 mL via INTRAVENOUS

## 2017-02-01 MED ORDER — POTASSIUM CHLORIDE IN NACL 40-0.9 MEQ/L-% IV SOLN
INTRAVENOUS | Status: DC
  Administered 2017-02-01 – 2017-02-03 (×4): 75 mL/h via INTRAVENOUS
  Filled 2017-02-01 (×5): qty 1000

## 2017-02-01 MED ORDER — PIPERACILLIN-TAZOBACTAM 3.375 G IVPB
3.3750 g | Freq: Three times a day (TID) | INTRAVENOUS | Status: DC
  Administered 2017-02-01 – 2017-02-03 (×6): 3.375 g via INTRAVENOUS
  Filled 2017-02-01 (×5): qty 50

## 2017-02-01 MED ORDER — BISACODYL 5 MG PO TBEC: 5.0000 mg | DELAYED_RELEASE_TABLET | Freq: Every day | ORAL | Status: DC | PRN

## 2017-02-01 MED ORDER — VANCOMYCIN HCL IN DEXTROSE 1-5 GM/200ML-% IV SOLN
1000.0000 mg | Freq: Once | INTRAVENOUS | Status: AC
  Administered 2017-02-01: 1000 mg via INTRAVENOUS
  Filled 2017-02-01: qty 200

## 2017-02-01 MED ORDER — HYDROCODONE-ACETAMINOPHEN 5-325 MG PO TABS: 1.0000 | ORAL_TABLET | ORAL | Status: DC | PRN

## 2017-02-01 NOTE — ED Provider Notes (Signed)
[facility] Emergency Department Provider Note ____________________________________________   I have reviewed the triage vital signs and the triage nursing note.  HISTORY  Chief Complaint Nausea; Emesis; Altered Mental Status; and Hypotension   Historian Patient  HPI Alexis Barry is a 83 y.o. female with history of atrial fibrillation, past history of breast cancer, history of CHF, lives at home alone, has had nausea vomiting with mild diarrhea for 2 or 3 days.  She had a mild cough.  She feels weak all over.  No known fever.  No abdominal pain.  Symptoms are moderate.   Past Medical History:  Diagnosis Date  . Atrial fibrillation ([place])   . Breast cancer ([place]) 08/21/12   left mastectomy  . Cancer [facility])    Left breast cancer s/p left mastectomy, chemo and radiation  . CHF (congestive heart failure) ([place])   . Goiter   . Hypertension   . Mild dementia   . Personal history of chemotherapy   . Personal history of radiation therapy     Patient Active Problem List   Diagnosis Date Noted  . Low back pain 01/28/2017  . Bilateral leg edema 08/17/2015  . Severe tricuspid valve insufficiency 01/12/2015  . Primary malignant neoplasm of lower outer quadrant of left female breast ([place]) 08/02/2014  . Benign essential hypertension 06/03/2014  . Chronic a-fib ([place]) 02/22/2014  . Breast carcinoma ([place]) 04/06/2013  . Hyperlipidemia, mixed 04/06/2013    Past Surgical History:  Procedure Laterality Date  . BREAST SURGERY     Left mastectomy  . MASTECTOMY Left 08/21/12   had chemo and rad  . THYROID SURGERY  1977    Prior to Admission medications   Medication Sig Start Date End Date Taking? Authorizing Provider  apixaban (ELIQUIS) 5 MG TABS tablet Take 5 mg by mouth 2 (two) times daily.   Yes [provider]  diltiazem (CARDIZEM CD) 180 MG 24 hr capsule Take 180 mg by mouth daily.   Yes [provider]  furosemide (LASIX) 20 MG tablet  Take 40 mg by mouth every morning.   Yes [provider]  letrozole (FEMARA) 2.5 MG tablet TAKE 1 TABLET(2.5 MG) BY MOUTH DAILY 01/20/17  Yes Lloyd Huger, MD  metoprolol (LOPRESSOR) 50 MG tablet Take 50 mg by mouth 2 (two) times daily.   Yes [provider]  rosuvastatin (CRESTOR) 10 MG tablet Take 10 mg by mouth daily.   Yes [provider]  acetaminophen (TYLENOL) 500 MG tablet Take 500 mg by mouth every 6 (six) hours as needed.    [provider]  alendronate (FOSAMAX) 70 MG tablet TAKE 1 TABLET(70 MG) BY MOUTH 1 TIME A WEEK WITH A FULL GLASS OF WATER AND ON AN EMPTY STOMACH Patient not taking: Reported on 02/01/2017 12/10/16   Lloyd Huger, MD    No Known Allergies  Family History  Problem Relation Age of Onset  . Breast cancer Maternal Aunt   . CAD Father     Social History Social History   Tobacco Use  . Smoking status: Never Smoker  . Smokeless tobacco: Never Used  Substance Use Topics  . Alcohol use: No  . Drug use: No    Review of Systems  Constitutional: Negative for fever. Eyes: Negative for visual changes. ENT: Negative for sore throat. Cardiovascular: Negative for chest pain. Respiratory: Negative for shortness of breath.  Defer mild nonproductive cough Gastrointestinal: Negative for abdominal pain, positive for mild watery and nonbloody vomiting  and diarrhea. Genitourinary: Negative for dysuria. Musculoskeletal: Negative for back pain. Skin: Negative for rash. Neurological: Negative for headache.  ____________________________________________   PHYSICAL EXAM:  VITAL SIGNS: ED Triage Vitals  Enc Vitals Group     BP 02/01/17 1120 (!) 126/55     Pulse Rate 02/01/17 1120 (!) 54     Resp 02/01/17 1120 14     Temp 02/01/17 1120 (!) 97.5 F (36.4 C)     Temp Source 02/01/17 1120 Oral     SpO2 02/01/17 1120 96 %     Weight 02/01/17 1121 160 lb (72.6 kg)     Height 02/01/17 1121 5\' 7"  (1.702 m)     Head  Circumference --      Peak Flow --      Pain Score 02/01/17 1120 0     Pain Loc --      Pain Edu? --      Excl. in [place]? --      Constitutional: Alert and oriented. Well appearing and in no distress. HEENT   Head: Normocephalic and atraumatic.      Eyes: Conjunctivae are normal. Pupils equal and round.       Ears:         Nose: No congestion/rhinnorhea.   Mouth/Throat: Mucous membranes are moist.   Neck: No stridor. Cardiovascular/Chest: Normal rate, regular rhythm.  No murmurs, rubs, or gallops. Respiratory: Normal respiratory effort without tachypnea nor retractions. Breath sounds are clear and equal bilaterally. No wheezes/rales/rhonchi. Gastrointestinal: Soft. No distention, no guarding, no rebound. Nontender.    Genitourinary/rectal:Deferred Musculoskeletal: Nontender with normal range of motion in all extremities. No joint effusions.  No lower extremity tenderness.  No edema. Neurologic:  Normal speech and language. No gross or focal neurologic deficits are appreciated. Skin:  Skin is warm, dry and intact. No rash noted. Psychiatric: Mood and affect are normal. Speech and behavior are normal. Patient exhibits appropriate insight and judgment.   ____________________________________________  LABS (pertinent positives/negatives) I, Lisa Roca, MD the attending physician have reviewed the labs noted below.  Labs Reviewed  LACTIC ACID, PLASMA - Abnormal; Notable for the following components:      Result Value   Lactic Acid, Venous 4.6 (*)    All other components within normal limits  CBC WITH DIFFERENTIAL/PLATELET - Abnormal; Notable for the following components:   WBC 11.2 (*)    RDW 15.5 (*)    Platelets 114 (*)    Neutro Abs 9.8 (*)    Lymphs Abs 0.6 (*)    All other components within normal limits  TROPONIN I - Abnormal; Notable for the following components:   Troponin I 0.08 (*)    All other components within normal limits  COMPREHENSIVE METABOLIC PANEL -  Abnormal; Notable for the following components:   Sodium 121 (*)    Potassium 2.4 (*)    Chloride 84 (*)    Glucose, Bld 182 (*)    Creatinine, Ser 1.71 (*)    Calcium 7.1 (*)    Total Protein 4.5 (*)    Albumin 2.4 (*)    AST 242 (*)    Alkaline Phosphatase 276 (*)    Total Bilirubin 5.3 (*)    GFR calc non Af Amer 27 (*)    GFR calc Af Amer 31 (*)    All other components within normal limits  CULTURE, BLOOD (ROUTINE X 2)  CULTURE, BLOOD (ROUTINE X 2)  INFLUENZA PANEL BY PCR (TYPE A & B)  LACTIC ACID,  PLASMA  URINALYSIS, COMPLETE (UACMP) WITH MICROSCOPIC    ____________________________________________    EKG I, Lisa Roca, MD, the attending physician have personally viewed and interpreted all ECGs.  51 bpm.  Atrial fibrillation.  Left axis deviation.  Nonspecific ST and T wave ____________________________________________  RADIOLOGY All Xrays were viewed by me.  Imaging interpreted by Radiologist, and I, Lisa Roca, MD the attending physician have reviewed the radiologist interpretation noted below.  Chest x-ray portable:  IMPRESSION: Large right paratracheal mass, increased in size since prior study. This was shown on prior CT to represent intrathoracic/substernal goiter.  Cardiomegaly.  Small effusions with bibasilar atelectasis.  Limited right upper quadrant: Pending __________________________________________  PROCEDURES  Procedure(s) performed: Peripheral IV.  Placed by myself Dr. Reita Cliche MD.  Location left external jugular vein.  Accessed with 20-gauge IV and secured with Tegaderm.  Critical Care performed: None   ____________________________________________  ED COURSE / ASSESSMENT AND PLAN  Pertinent labs & imaging results that were available during my care of the patient were reviewed by me and considered in my medical decision making (see chart for details).    Patient arrived hypotensive, started on IV fluids by arm IV, blood pressure, 2 above  601 systolic.  She has had some viral type symptoms including mild URI/cough with mild vomiting or diarrhea but a couple of days now she is feeling weak and was found to be hypotensive.  She does not have a fever does not report having fever.  Workup initiated.  Patient is a poor IV access and lost access to the right upper extremity due to infiltration.  I did place a left EJ.  She found to have multiple abnormalities including acute renal failure, concern for dehydration, electrolyte disturbances.  I did place the patient on sepsis pathway and cover for unknown source.  Patient given fluid resuscitation for elevated lactate.  Elevated LFTs and bili, uncertain etiology, patient is not exactly complaining of abdominal pain, but I will add on ultrasound of the right upper quadrant.  DIFFERENTIAL DIAGNOSIS: Including but not limited to dehydration, urinary tract infection, electrolyte disturbance, sepsis, pneumonia, influenza, etc.  CONSULTATIONS: Hospitalist for admission.   Patient / Family / Caregiver informed of clinical course, medical decision-making process, and agree with plan.    ___________________________________________   FINAL CLINICAL IMPRESSION(S) / ED DIAGNOSES   Final diagnoses:  Acute renal failure, unspecified acute renal failure type ([place])  Sepsis, due to unspecified organism ([place])  Hypokalemia  Hyponatremia  LFT elevation  Troponin level elevated      ___________________________________________        Note: This dictation was prepared with Dragon dictation. Any transcriptional errors that result from this process are unintentional    Lisa Roca, MD 02/01/17 1507

## 2017-02-01 NOTE — Progress Notes (Signed)
CRITICAL VALUE ALERT  Critical Value:  Lactic Acid 5.2, INR 4.05  Date & Time Notied:  02/01/2017  Provider Notified: Dr. Claria Dice  Orders Received/Actions taken: Patient received IV fluids bolus and IV antibiotic and patient is receiving NS with 40 meq of KCL at 75 ml/hr. Patient not having active bleeding and talked to pharmacy and per pharmacy do not need to treat INR level. RN will continue to monitor.

## 2017-02-01 NOTE — ED Triage Notes (Signed)
Patient c/o fatigue, vomiting, balance trouble, congestion x 1 week

## 2017-02-01 NOTE — ED Triage Notes (Signed)
Pt from [facility] urgent [facility] via ems with reports of nausea and vomiting x 1 week. Per pts daughter pt was not acting herself seeming more sleepy than usual. Pt denies pain. When ems arrived to [place] pts bp was 74/55.

## 2017-02-01 NOTE — ED Notes (Signed)
Pt to ultrasound - tech made aware we need urine sample if she needs to go.

## 2017-02-01 NOTE — Progress Notes (Addendum)
CODE SEPSIS - PHARMACY COMMUNICATION  **Broad Spectrum Antibiotics should be administered within 1 hour of Sepsis diagnosis**  Time Code Sepsis Called/Page Received: 02/01/17  1330  Antibiotics Ordered: Zosyn/Vancomycin  Time of 1st antibiotic administration: 02/01/17 1330  Additional action taken by pharmacy: called RN re: Vancomycin- RN to hang  If necessary, Name of Provider/Nurse Contacted:  Rae Lips ,PharmD Clinical Pharmacist  02/01/2017  1:38 PM

## 2017-02-01 NOTE — ED Notes (Signed)
Dr. Chen at bedside.

## 2017-02-01 NOTE — H&P (Addendum)
[facility] [place] NAME: Alexis Barry    MR#:  [id number]  DATE OF BIRTH:  Nov 06, 1934  DATE OF ADMISSION:  02/01/2017  PRIMARY CARE PHYSICIAN: Lavera Guise, MD   REQUESTING/REFERRING PHYSICIAN: Lisa Roca, MD  CHIEF COMPLAINT:   Chief Complaint  Patient presents with  . Nausea  . Emesis  . Altered Mental Status  . Hypotension   Nausea, vomiting, diarrhea 2-3 days. HISTORY OF PRESENT ILLNESS:  Alexis Barry  is a 83 y.o. female with a known history of multiple medical problems as below.  Patient has had a nausea, vomiting and diarrhea for the past 3 days.  She has had a poor appetite for 1 week.  She denies any fever or chills, no dysuria or hematuria.  She was found hypotension, lactic acidosis, renal failure, low potassium at 2.4 and low sodium at 121.  Dr. Reita Cliche, ED physician, started sepsis protocol.  The patient was given vancomycin and Zosyn in the ED.  Urinalysis is pending.  Chest x-ray is unremarkable.  PAST MEDICAL HISTORY:   Past Medical History:  Diagnosis Date  . Atrial fibrillation ([place])   . Breast cancer ([place]) 08/21/12   left mastectomy  . Cancer [facility])    Left breast cancer s/p left mastectomy, chemo and radiation  . CHF (congestive heart failure) ([place])   . Goiter   . Hypertension   . Mild dementia   . Personal history of chemotherapy   . Personal history of radiation therapy     PAST SURGICAL HISTORY:   Past Surgical History:  Procedure Laterality Date  . BREAST SURGERY     Left mastectomy  . MASTECTOMY Left 08/21/12   had chemo and rad  . THYROID SURGERY  1977    SOCIAL HISTORY:   Social History   Tobacco Use  . Smoking status: Never Smoker  . Smokeless tobacco: Never Used  Substance Use Topics  . Alcohol use: No    FAMILY HISTORY:   Family History  Problem Relation Age of Onset  . Breast cancer Maternal Aunt   . CAD Father     DRUG ALLERGIES:  No Known Allergies  REVIEW OF  SYSTEMS:   Review of Systems  Constitutional: Positive for malaise/fatigue. Negative for chills and fever.  HENT: Negative for sore throat.   Eyes: Negative for blurred vision and double vision.  Respiratory: Negative for cough, hemoptysis, shortness of breath, wheezing and stridor.   Cardiovascular: Negative for chest pain, palpitations, orthopnea and leg swelling.  Gastrointestinal: Positive for diarrhea, nausea and vomiting. Negative for abdominal pain, blood in stool and melena.  Genitourinary: Negative for dysuria, flank pain and hematuria.  Musculoskeletal: Negative for back pain and joint pain.  Skin: Negative for rash.  Neurological: Positive for weakness. Negative for dizziness, sensory change, focal weakness, seizures, loss of consciousness and headaches.  Endo/Heme/Allergies: Negative for polydipsia.  Psychiatric/Behavioral: Negative for depression. The patient is not nervous/anxious.     MEDICATIONS AT HOME:   Prior to Admission medications   Medication Sig Start Date End Date Taking? Authorizing Provider  apixaban (ELIQUIS) 5 MG TABS tablet Take 5 mg by mouth 2 (two) times daily.   Yes [provider]  diltiazem (CARDIZEM CD) 180 MG 24 hr capsule Take 180 mg by mouth daily.   Yes [provider]  furosemide (LASIX) 20 MG tablet Take 40 mg by mouth every morning.   Yes [provider]  letrozole (FEMARA) 2.5 MG  tablet TAKE 1 TABLET(2.5 MG) BY MOUTH DAILY 01/20/17  Yes Lloyd Huger, MD  metoprolol (LOPRESSOR) 50 MG tablet Take 50 mg by mouth 2 (two) times daily.   Yes [provider]  rosuvastatin (CRESTOR) 10 MG tablet Take 10 mg by mouth daily.   Yes [provider]  acetaminophen (TYLENOL) 500 MG tablet Take 500 mg by mouth every 6 (six) hours as needed.    [provider]  alendronate (FOSAMAX) 70 MG tablet TAKE 1 TABLET(70 MG) BY MOUTH 1 TIME A WEEK WITH A FULL GLASS OF WATER AND ON AN EMPTY STOMACH Patient not  taking: Reported on 02/01/2017 12/10/16   Lloyd Huger, MD      VITAL SIGNS:  Blood pressure 115/62, pulse (!) 49, temperature (!) 97.5 F (36.4 C), temperature source Oral, resp. rate 17, height 5\' 7"  (1.702 m), weight 160 lb (72.6 kg), SpO2 98 %.  PHYSICAL EXAMINATION:  Physical Exam  GENERAL:  83 y.o.-year-old patient lying in the bed with no acute distress.  EYES: Pupils equal, round, reactive to light and accommodation. No scleral icterus. Extraocular muscles intact.  HEENT: Head atraumatic, normocephalic. Oropharynx and nasopharynx clear.  Dry oral mucosa. NECK:  Supple, no jugular venous distention. No thyroid enlargement, no tenderness.  LUNGS: Normal breath sounds bilaterally, no wheezing, rales,rhonchi or crepitation. No use of accessory muscles of respiration.  CARDIOVASCULAR: S1, S2 normal. No murmurs, rubs, or gallops.  ABDOMEN: Soft, nontender, nondistended. Bowel sounds present. No organomegaly or mass.  EXTREMITIES: No pedal edema, cyanosis, or clubbing.  NEUROLOGIC: Cranial nerves II through XII are intact. Muscle strength 4/5 in all extremities. Sensation intact. Gait not checked.  PSYCHIATRIC: The patient is alert and oriented x 3.  SKIN: No obvious rash, lesion, or ulcer.   LABORATORY PANEL:   CBC Recent Labs  Lab 02/01/17 1108  WBC 11.2*  HGB 14.0  HCT 42.5  PLT 114*   ------------------------------------------------------------------------------------------------------------------  Chemistries  Recent Labs  Lab 02/01/17 1248  NA 121*  K 2.4*  CL 84*  CO2 26  GLUCOSE 182*  BUN 18  CREATININE 1.71*  CALCIUM 7.1*  AST 242*  ALT 47  ALKPHOS 276*  BILITOT 5.3*   ------------------------------------------------------------------------------------------------------------------  Cardiac Enzymes Recent Labs  Lab 02/01/17 1108  TROPONINI 0.08*    ------------------------------------------------------------------------------------------------------------------  RADIOLOGY:  Dg Chest Port 1 View  Result Date: 02/01/2017 CLINICAL DATA:  Weakness, cough EXAM: PORTABLE CHEST 1 VIEW COMPARISON:  01/19/2015 FINDINGS: Large right paratracheal mass has increased since prior study. This was shown on prior CT to represent intrathoracic goiter. There is deviation of the trachea to the left. Cardiomegaly. Small bilateral pleural effusions. Bibasilar atelectasis. IMPRESSION: Large right paratracheal mass, increased in size since prior study. This was shown on prior CT to represent intrathoracic/substernal goiter. Cardiomegaly. Small effusions with bibasilar atelectasis. Electronically Signed   By: Rolm Baptise M.D.   On: 02/01/2017 12:24      IMPRESSION AND PLAN:   Sepsis, unclear etiology. The patient will be admitted to medical floor with telemetry monitor. Continue vancomycin and Zosyn, follow-up CBC and cultures.  Possible UTI. Continue antibiotics, IV fluids support.  Hypotension due to above.  Hold hypertension medication.  Lactic acidosis.  Treatment as above, follow-up lactic acid level.  Acute renal failure, hold Lasix, start normal saline in the follow-up BMP. Hypokalemia.  Give IV potassium, follow-up potassium level and magnesium level. Hyponatremia, start normal saline IV and follow-up BMP.  Abnormal liver function test, follow-up abdominal ultrasound and liver  function test.  Elevated troponin, follow-up troponin level, continue apixaban pharmacy to dose.  History of chronic A. fib.  Continue apixaban but hold Cardizem due to low blood pressure and bradycardia.  Hypertension.  Hold Lasix and Lopressor and Cardizem due to hypotension.  Generalized weakness. PT evaluation when patient is a stable.   All the records are reviewed and case discussed with ED provider. Management plans discussed with the patient, her  daughter and son and they are in agreement.  CODE STATUS: DNR  TOTAL TIME TAKING CARE OF THIS PATIENT: 60 minutes.    Demetrios Loll M.D on 02/01/2017 at 3:45 PM  Between 7am to 6pm - Pager - [phone]  After 6pm go to [email or web address] - password [email or web address] [facility]  [facility]  Office  [phone]  CC: Primary care physician; Lavera Guise, MD   Note: This dictation was prepared with Dragon dictation along with smaller phrase technology. Any transcriptional errors that result from this process are unin

## 2017-02-01 NOTE — Progress Notes (Signed)
Pt. Daughter called, she knew the password update given

## 2017-02-01 NOTE — Progress Notes (Signed)
Per pt. She has not had diarrhea since Wednesday, she continues on enteric precautions until GI panel is complete.

## 2017-02-01 NOTE — ED Notes (Signed)
Room changed to 2a

## 2017-02-01 NOTE — ED Provider Notes (Signed)
[facility]    CSN: [id number] Arrival date & time: 02/01/17  2426  History   Chief Complaint Chief Complaint  Patient presents with  . Emesis    HPI  83 year old female with a history of atrial fibrillation, hypertension, breast cancer presents with fatigue, nausea, vomiting, cough and congestion.  Family member states that she has not been doing well for the past week.  She has had ongoing cough and congestion.  Cough is productive.  She has had persistent nausea and vomiting.  She has had little PO intake.  Associated fatigue and difficulty in relating.  Family member states that she is unable to ambulate.  No reports of fever.  No known inciting factor.  No known exacerbating or relieving factors.  She has continued to take her home medication despite poor oral intake (occluding Lasix).  No reports of abdominal pain.  No other reported symptoms.    Past Medical History:  Diagnosis Date  . Atrial fibrillation ([place])   . Breast cancer ([place]) 08/21/12   left mastectomy  . Cancer [facility])    Left breast cancer s/p left mastectomy, chemo and radiation  . CHF (congestive heart failure) ([place])   . Goiter   . Hypertension   . Mild dementia   . Personal history of chemotherapy   . Personal history of radiation therapy     Patient Active Problem List   Diagnosis Date Noted  . Low back pain 01/28/2017  . Bilateral leg edema 08/17/2015  . Severe tricuspid valve insufficiency 01/12/2015  . Primary malignant neoplasm of lower outer quadrant of left female breast ([place]) 08/02/2014  . Benign essential hypertension 06/03/2014  . Chronic a-fib ([place]) 02/22/2014  . Breast carcinoma ([place]) 04/06/2013  . Hyperlipidemia, mixed 04/06/2013    Past Surgical History:  Procedure Laterality Date  . BREAST SURGERY     Left mastectomy  . MASTECTOMY Left 08/21/12   had chemo and rad  . THYROID SURGERY  1977    OB History    No data available       Home Medications    Prior to  Admission medications   Medication Sig Start Date End Date Taking? Authorizing Provider  acetaminophen (TYLENOL) 500 MG tablet Take 500 mg by mouth every 6 (six) hours as needed.   Yes [provider]  alendronate (FOSAMAX) 70 MG tablet TAKE 1 TABLET(70 MG) BY MOUTH 1 TIME A WEEK WITH A FULL GLASS OF WATER AND ON AN EMPTY STOMACH 12/10/16  Yes Lloyd Huger, MD  apixaban (ELIQUIS) 5 MG TABS tablet Take 5 mg by mouth 2 (two) times daily.   Yes [provider]  diltiazem (CARDIZEM CD) 180 MG 24 hr capsule Take 180 mg by mouth daily.   Yes [provider]  furosemide (LASIX) 20 MG tablet Take 40 mg by mouth every morning.   Yes [provider]  letrozole (FEMARA) 2.5 MG tablet TAKE 1 TABLET(2.5 MG) BY MOUTH DAILY 01/20/17  Yes Lloyd Huger, MD  metoprolol (LOPRESSOR) 50 MG tablet Take 50 mg by mouth 2 (two) times daily.   Yes [provider]  rosuvastatin (CRESTOR) 10 MG tablet Take 10 mg by mouth daily.   Yes [provider]    Family History Family History  Problem Relation Age of Onset  . Breast cancer Maternal Aunt   . CAD Father     Social History Social History   Tobacco Use  . Smoking status: Never Smoker  . Smokeless  tobacco: Never Used  Substance Use Topics  . Alcohol use: No  . Drug use: No     Allergies   Patient has no known allergies.   Review of Systems Review of Systems  Constitutional: Positive for activity change and appetite change.  HENT: Positive for congestion.   Respiratory: Positive for cough.   Gastrointestinal: Positive for nausea and vomiting.  Musculoskeletal:       Cannot ambulate.   Physical Exam Triage Vital Signs ED Triage Vitals  Enc Vitals Group     BP 02/01/17 0948 (!) 74/45     Pulse Rate 02/01/17 0948 (!) 56     Resp --      Temp 02/01/17 0948 97.6 F (36.4 C)     Temp Source 02/01/17 0948 Oral     SpO2 02/01/17 0948 100 %     Weight 02/01/17 0952 160 lb (72.6 kg)      Height 02/01/17 0952 5\' 7"  (1.702 m)     Head Circumference --      Peak Flow --      Pain Score --      Pain Loc --      Pain Edu? --      Excl. in [place]? --    Updated Vital Signs BP (!) 74/45 (BP Location: Left Arm)   Pulse (!) 56   Temp 97.6 F (36.4 C) (Oral)   Ht 5\' 7"  (1.702 m)   Wt 160 lb (72.6 kg)   SpO2 100%   BMI 25.06 kg/m     Physical Exam  Constitutional:  Ill-appearing elderly female sitting in wheelchair. Lethargic.  HENT:  Head: Normocephalic and atraumatic.  Oropharynx -thrush noted.  Cardiovascular:  Bradycardic.  Regular rhythm.  Murmur noted.  Pulmonary/Chest: Effort normal and breath sounds normal. No respiratory distress. She has no wheezes. She has no rales.  Abdominal: Soft. She exhibits no distension. There is no tenderness.  Neurological:  Patient arouses with questioning but then closes her eyes back.  Appears lethargic.  Psychiatric:  Flat affect.  Nursing note and vitals reviewed.  UC Treatments / Results  Labs (all labs ordered are listed, but only abnormal results are displayed) Labs Reviewed - No data to display  EKG  EKG Interpretation None       Radiology No results found.  Procedures Procedures (including critical care time)  Medications Ordered in UC Medications  sodium chloride 0.9 % bolus 1,000 mL (1,000 mLs Intravenous New Bag/Given 02/01/17 1021)     Initial Impression / Assessment and Plan / UC Course  I have reviewed the triage vital signs and the nursing notes.  Pertinent labs & imaging results that were available during my care of the patient were reviewed by me and considered in my medical decision making (see chart for details).    83 year old female presents with productive cough, nausea, vomiting, inability to ambulate, fatigue.  Patient appears ill.  Hypotensive.  Dehydrated.  Given her clinical picture, an IV was started and patient was given intravenous fluids.  EMS was called and patient is being  transported to the ER.  Final Clinical Impressions(s) / UC Diagnoses   Final diagnoses:  Hypotension, unspecified hypotension type  Dehydration    ED Discharge Orders    None     Controlled Substance Prescriptions [place] Controlled Substance Registry consulted? Not Applicable   Coral Spikes, DO 02/01/17 1033

## 2017-02-01 NOTE — ED Notes (Signed)
Pt completed her liter bolus from [facility] but unable to sign off

## 2017-02-01 NOTE — Progress Notes (Signed)
Pharmacy Antibiotic Note  Alexis Barry is a 83 y.o. female admitted on 02/01/2017 with sepsis of unknown source.  Pharmacy has been consulted for Vancomycin and Zosyn dosing.  Patient w/ N/V/D  Plan: Patient received Zosyn 3.375gm x 1 in ER and Vancomycin 1 gram IV x 1 in ER.  Will continue with stacked dosing of Vancomycin Vancomycin 750 IV every 24 hours.  Goal trough 15-20 mcg/mL.  Per notes patient is dehydrated, therefore anticipate improvement in renal fxn and went ahead and dosed Q24h. Ke 0.026  T1/2 26.6  Vd 50.8. Did not order Vancomycin trough yet as anticipate change in renal fxn. Will need to follow up.    Height: 5\' 7"  (170.2 cm) Weight: 160 lb (72.6 kg) IBW/kg (Calculated) : 61.6  Temp (24hrs), Avg:97.6 F (36.4 C), Min:97.5 F (36.4 C), Max:97.6 F (36.4 C)  Recent Labs  Lab 02/01/17 1108 02/01/17 1248  WBC 11.2*  --   CREATININE  --  1.71*  LATICACIDVEN 4.6*  --     Estimated Creatinine Clearance: 25.5 mL/min (A) (by C-G formula based on SCr of 1.71 mg/dL (H)).    No Known Allergies  Antimicrobials this admission: Zosyn 1/12 >>   Vanc 1/12 >>    Dose adjustments this admission:    Microbiology results: 1/12 BCx:     UCx:      Sputum:      MRSA PCR:   1/12 CXR appears negative  Thank you for allowing pharmacy to be a part of this patient's care.  Essie Gehret A 02/01/2017 3:35 PM

## 2017-02-01 NOTE — ED Notes (Signed)
Pt was in via ems and a 22g was started to the left [place]. It was noted that pt has had mastectomy in the past 5 years. IV was discontinued. Prior to arrival EMS gave 4mg  zofran and approx. 276ml NS.

## 2017-02-01 NOTE — Consult Note (Signed)
ANTICOAGULATION CONSULT NOTE - Initial Consult  Pharmacy Consult for Apixaban  Indication: atrial fibrillation  No Known Allergies  Patient Measurements: Height: 5\' 7"  (170.2 cm) Weight: 160 lb (72.6 kg) IBW/kg (Calculated) : 61.6  Vital Signs: Temp: 97.3 F (36.3 C) (01/12 1730) Temp Source: Oral (01/12 1730) BP: 113/53 (01/12 1730) Pulse Rate: 49 (01/12 1730)  Labs: Recent Labs    02/01/17 1108 02/01/17 1248  HGB 14.0  --   HCT 42.5  --   PLT 114*  --   CREATININE  --  1.71*  TROPONINI 0.08*  --     Estimated Creatinine Clearance: 25.5 mL/min (A) (by C-G formula based on SCr of 1.71 mg/dL (H)).   Medical History: Past Medical History:  Diagnosis Date  . Atrial fibrillation ([place])   . Breast cancer ([place]) 08/21/12   left mastectomy  . Cancer [facility])    Left breast cancer s/p left mastectomy, chemo and radiation  . CHF (congestive heart failure) ([place])   . Goiter   . Hypertension   . Mild dementia   . Personal history of chemotherapy   . Personal history of radiation therapy     Assessment: Pharmacy consulted for apixaban dosing in 83 yo female with PMH of A. Fib. Patient is taking apixaban 5mg  BID PTA.   Plan:  Based on patients age (82) and Scr >1.5 (02/01/17: 1.60mL/dL), will start patient on Apixaban 2.5mg  BID.  Will monitor renal function and adjust dose if needed.   Pernell Dupre, PharmD, BCPS Clinical Pharmacist 02/01/2017 5:54 PM

## 2017-02-01 NOTE — Progress Notes (Signed)
Paged and text paged Dr. Bridgett Larsson about patient's lactic acid of 5.2, awaiting MD to call back. Patient on IV antibiotic and lab is drawing blood culture now. RN will continue to monitor.

## 2017-02-02 ENCOUNTER — Inpatient Hospital Stay: Payer: Medicare Other

## 2017-02-02 LAB — CBC
HEMATOCRIT: 40.2 % (ref 35.0–47.0)
HEMOGLOBIN: 13.4 g/dL (ref 12.0–16.0)
MCH: 29.5 pg (ref 26.0–34.0)
MCHC: 33.3 g/dL (ref 32.0–36.0)
MCV: 88.6 fL (ref 80.0–100.0)
Platelets: 102 10*3/uL — ABNORMAL LOW (ref 150–440)
RBC: 4.54 MIL/uL (ref 3.80–5.20)
RDW: 15.6 % — AB (ref 11.5–14.5)
WBC: 11.1 10*3/uL — AB (ref 3.6–11.0)

## 2017-02-02 LAB — BASIC METABOLIC PANEL
ANION GAP: 11 (ref 5–15)
BUN: 17 mg/dL (ref 6–20)
CO2: 24 mmol/L (ref 22–32)
Calcium: 6.9 mg/dL — ABNORMAL LOW (ref 8.9–10.3)
Chloride: 89 mmol/L — ABNORMAL LOW (ref 101–111)
Creatinine, Ser: 1.58 mg/dL — ABNORMAL HIGH (ref 0.44–1.00)
GFR, EST AFRICAN AMERICAN: 35 mL/min — AB (ref >60)
GFR, EST NON AFRICAN AMERICAN: 30 mL/min — AB (ref >60)
Glucose, Bld: 149 mg/dL — ABNORMAL HIGH (ref 65–99)
POTASSIUM: 3.3 mmol/L — AB (ref 3.5–5.1)
SODIUM: 124 mmol/L — AB (ref 135–145)

## 2017-02-02 LAB — HEPATIC FUNCTION PANEL
ALBUMIN: 2.3 g/dL — AB (ref 3.5–5.0)
ALK PHOS: 291 U/L — AB (ref 38–126)
ALT: 49 U/L (ref 14–54)
AST: 251 U/L — AB (ref 15–41)
BILIRUBIN TOTAL: 5.5 mg/dL — AB (ref 0.3–1.2)
Bilirubin, Direct: 2.8 mg/dL — ABNORMAL HIGH (ref 0.1–0.5)
Indirect Bilirubin: 2.7 mg/dL — ABNORMAL HIGH (ref 0.3–0.9)
Total Protein: 4.3 g/dL — ABNORMAL LOW (ref 6.5–8.1)

## 2017-02-02 LAB — TROPONIN I: TROPONIN I: 0.05 ng/mL — AB (ref <0.03)

## 2017-02-02 LAB — TSH: TSH: 2.093 u[IU]/mL (ref 0.350–4.500)

## 2017-02-02 MED ORDER — PROMETHAZINE HCL 25 MG/ML IJ SOLN
6.2500 mg | Freq: Four times a day (QID) | INTRAMUSCULAR | Status: DC | PRN
  Administered 2017-02-04 – 2017-02-05 (×4): 6.25 mg via INTRAVENOUS
  Filled 2017-02-02 (×4): qty 1

## 2017-02-02 MED ORDER — PROMETHAZINE HCL 25 MG/ML IJ SOLN
12.5000 mg | Freq: Four times a day (QID) | INTRAMUSCULAR | Status: DC | PRN
  Administered 2017-02-02: 12.5 mg via INTRAVENOUS
  Filled 2017-02-02: qty 1

## 2017-02-02 MED ORDER — SODIUM CHLORIDE 0.9% FLUSH
3.0000 mL | Freq: Two times a day (BID) | INTRAVENOUS | Status: DC
  Administered 2017-02-02 – 2017-02-06 (×8): 3 mL via INTRAVENOUS

## 2017-02-02 NOTE — Progress Notes (Signed)
Pharmacy Note  83 y/o F with elevated LFTs ordered Phenergan 12.5 mg iv prn nausea not relieved by Zofran.   Hepatic Function Latest Ref Rng & Units 02/02/2017 02/01/2017 10/01/2012  Total Protein 6.5 - 8.1 g/dL 4.3(L) 4.5(L) 6.5  Albumin 3.5 - 5.0 g/dL 2.3(L) 2.4(L) 3.6  AST 15 - 41 U/L 251(H) 242(H) 17  ALT 14 - 54 U/L 49 47 12  Alk Phosphatase 38 - 126 U/L 291(H) 276(H) 94  Total Bilirubin 0.3 - 1.2 mg/dL 5.5(H) 5.3(H) 0.3  Bilirubin, Direct 0.1 - 0.5 mg/dL 2.8(H) 2.8(H) -   Will decrease promethazine dosing to 6.25 mg for liver dysfunction.   Ulice Dash, PharmD Clinical Pharmacist

## 2017-02-02 NOTE — Progress Notes (Signed)
Pt. Slept well throughout the night with c/o nausea x1. Pt medicated with effective results. Pt is coughing up thick sputum. No c/o pain, SOB or acute distress noted. Pt woke around 3a.m. Has been watching television since.

## 2017-02-02 NOTE — Consult Note (Addendum)
[facility] Consultation Note  Patient ID: Alexis Barry, MRN: [id number], DOB/AGE: 29-Oct-1934 83 y.o. Admit date: 02/01/2017   Date of Consult: 02/02/2017 Primary Physician: Lavera Guise, MD Primary Cardiologist: Nehemiah Massed  Chief Complaint:  Chief Complaint  Patient presents with  . Nausea  . Emesis  . Altered Mental Status  . Hypotension   Reason for Consult: Bradycardia with atrial fibrillation  HPI: 83 y.o. female with known chronic nonvalvular atrial fibrillation diastolic dysfunction heart failure mild dementia essential hypertension and mixed hyperlipidemia who has arrived with significant weakness fatigue shortness of breath and apparent sepsis.  The patient has been placed on appropriate medication management for this issue including IV fluids for her glomerular filtration rate which is decreased.  The patient is feeling slightly better but now she has had multiple times where her heart rate is slow.  Her EKG shows atrial fibrillation with inferior infarct age undetermined and 51 bpm.  Previously she has been on appropriate medication management for heart rate control and no apparent symptoms although this control is slightly more significant.  The patient also has been on appropriate anticoagulation for further risk reduction and stroke with atrial fibrillation with no further evidence of bleeding complications.  Past Medical History:  Diagnosis Date  . Atrial fibrillation ([place])   . Breast cancer ([place]) 08/21/12   left mastectomy  . Cancer [facility])    Left breast cancer s/p left mastectomy, chemo and radiation  . CHF (congestive heart failure) ([place])   . Goiter   . Hypertension   . Mild dementia   . Personal history of chemotherapy   . Personal history of radiation therapy       Surgical History:  Past Surgical History:  Procedure Laterality Date  . BREAST SURGERY     Left mastectomy  . MASTECTOMY Left 08/21/12   had chemo and rad  . THYROID SURGERY  1977      Home Meds: Prior to Admission medications   Medication Sig Start Date End Date Taking? Authorizing Provider  apixaban (ELIQUIS) 5 MG TABS tablet Take 5 mg by mouth 2 (two) times daily.   Yes [provider]  diltiazem (CARDIZEM CD) 180 MG 24 hr capsule Take 180 mg by mouth daily.   Yes [provider]  furosemide (LASIX) 20 MG tablet Take 40 mg by mouth every morning.   Yes [provider]  letrozole (FEMARA) 2.5 MG tablet TAKE 1 TABLET(2.5 MG) BY MOUTH DAILY 01/20/17  Yes Lloyd Huger, MD  metoprolol (LOPRESSOR) 50 MG tablet Take 50 mg by mouth 2 (two) times daily.   Yes [provider]  rosuvastatin (CRESTOR) 10 MG tablet Take 10 mg by mouth daily.   Yes [provider]  acetaminophen (TYLENOL) 500 MG tablet Take 500 mg by mouth every 6 (six) hours as needed.    [provider]  alendronate (FOSAMAX) 70 MG tablet TAKE 1 TABLET(70 MG) BY MOUTH 1 TIME A WEEK WITH A FULL GLASS OF WATER AND ON AN EMPTY STOMACH Patient not taking: Reported on 02/01/2017 12/10/16   Lloyd Huger, MD    Inpatient Medications:  . apixaban  2.5 mg Oral BID  . sodium chloride flush  3 mL Intravenous Q12H   . 0.9 % NaCl with KCl 40 mEq / L 75 mL/hr (02/02/17 0643)  . piperacillin-tazobactam (ZOSYN)  IV 3.375 g (02/02/17 0459)  . Vancomycin Stopped (02/02/17 0449)    Allergies: No Known Allergies  Social History  Socioeconomic History  . Marital status: Widowed    Spouse name: Not on file  . Number of children: Not on file  . Years of education: Not on file  . Highest education level: Not on file  Social Needs  . Financial resource strain: Not on file  . Food insecurity - worry: Not on file  . Food insecurity - inability: Not on file  . Transportation needs - medical: Not on file  . Transportation needs - non-medical: Not on file  Occupational History  . Not on file  Tobacco Use  . Smoking status: Never Smoker  . Smokeless  tobacco: Never Used  Substance and Sexual Activity  . Alcohol use: No  . Drug use: No  . Sexual activity: Not on file  Other Topics Concern  . Not on file  Social History Narrative  . Not on file     Family History  Problem Relation Age of Onset  . Breast cancer Maternal Aunt   . CAD Father      Review of Systems Positive for weakness fatigue Negative for: General:  chills, fever, night sweats or weight changes.  Cardiovascular: PND orthopnea syncope dizziness  Dermatological skin lesions rashes Respiratory: Cough congestion Urologic: Frequent urination urination at night and hematuria Abdominal: negative for nausea, vomiting, diarrhea, bright red blood per rectum, melena, or hematemesis Neurologic: negative for visual changes, and/or hearing changes  All other systems reviewed and are otherwise negative except as noted above.  Labs: Recent Labs    02/01/17 1108 02/01/17 1805 02/02/17 0026  TROPONINI 0.08* 0.06* 0.05*   Lab Results  Component Value Date   WBC 11.1 (H) 02/02/2017   HGB 13.4 02/02/2017   HCT 40.2 02/02/2017   MCV 88.6 02/02/2017   PLT 102 (L) 02/02/2017    Recent Labs  Lab 02/02/17 0026  NA 124*  K 3.3*  CL 89*  CO2 24  BUN 17  CREATININE 1.58*  CALCIUM 6.9*  PROT 4.3*  BILITOT 5.5*  ALKPHOS 291*  ALT 49  AST 251*  GLUCOSE 149*   Lab Results  Component Value Date   CHOL 166 12/09/2011   HDL 52 12/09/2011   LDLCALC 97 12/09/2011   TRIG 85 12/09/2011   No results found for: DDIMER  Radiology/Studies:  Dg Chest Port 1 View  Result Date: 02/01/2017 CLINICAL DATA:  Weakness, cough EXAM: PORTABLE CHEST 1 VIEW COMPARISON:  01/19/2015 FINDINGS: Large right paratracheal mass has increased since prior study. This was shown on prior CT to represent intrathoracic goiter. There is deviation of the trachea to the left. Cardiomegaly. Small bilateral pleural effusions. Bibasilar atelectasis. IMPRESSION: Large right paratracheal mass, increased  in size since prior study. This was shown on prior CT to represent intrathoracic/substernal goiter. Cardiomegaly. Small effusions with bibasilar atelectasis. Electronically Signed   By: Rolm Baptise M.D.   On: 02/01/2017 12:24   US Abdomen Limited Ruq  Result Date: 02/01/2017 CLINICAL DATA:  Elevated LFTs EXAM: ULTRASOUND ABDOMEN LIMITED RIGHT UPPER QUADRANT COMPARISON:  None. FINDINGS: Gallbladder: 2.6 cm gallstone. No wall thickening or sonographic Murphy's. The gallstone is mobile. Common bile duct: Diameter: Normal diameter, 5 mm Liver: Multiple cysts, the largest 3.6 cm in the right lobe. Small solid-appearing nodules in the right hepatic lobe, measuring 1.3 cm and 9 mm maximally. Diffusely heterogeneous echotexture. Nodular contours of the liver suggest early cirrhosis. Portal vein is patent on color Doppler imaging with normal direction of blood flow towards the liver. IMPRESSION: Abnormal appearance of the liver  with markedly heterogeneous echotexture. Nodular contours of the surface suggests cirrhosis. Scattered cystic lesions throughout the liver. Two small possible solid nodules in the liver, the largest 1.3 cm in the right lobe. Cholelithiasis.  No sonographic evidence of acute cholecystitis. Electronically Signed   By: Rolm Baptise M.D.   On: 02/01/2017 16:07    EKG: Atrial fibrillation with inferior infarct age undetermined  Weights: Filed Weights   02/01/17 1121  Weight: 72.6 kg (160 lb)     Physical Exam: Blood pressure 121/63, pulse 65, temperature 98.2 F (36.8 C), temperature source Oral, resp. rate 18, height 5\' 7"  (1.702 m), weight 72.6 kg (160 lb), SpO2 94 %. Body mass index is 25.06 kg/m. General: Well developed, well nourished, in no acute distress. Head eyes ears nose throat: Normocephalic, atraumatic, sclera non-icteric, no xanthomas, nares are without discharge. No apparent thyromegaly and/or mass  Lungs: Normal respiratory effort.  Few wheezes, no rales, no rhonchi.   Heart: Irregular with normal S1 S2. no murmur gallop, no rub, PMI is normal size and placement, carotid upstroke normal without bruit, jugular venous pressure is normal Abdomen: Soft, non-tender, non-distended with normoactive bowel sounds. No hepatomegaly. No rebound/guarding. No obvious abdominal masses. Abdominal aorta is normal size without bruit Extremities: Trace edema. no cyanosis, no clubbing, no ulcers  Peripheral : 2+ bilateral upper extremity pulses, 2+ bilateral femoral pulses, 2+ bilateral dorsal pedal pulse Neuro: Alert and oriented. No facial asymmetry. No focal deficit. Moves all extremities spontaneously. Musculoskeletal: Normal muscle tone without kyphosis Psych:  Responds to questions appropriately with a normal affect.    Assessment: 83 year old female with a chronic nonvalvular atrial fibrillation chronic kidney disease stage III with weakness and fatigue and possible sepsis without evidence of congestive heart failure or myocardial infarction  Plan: 1.  Abstain from beta-blocker and calcium channel blocker for heart rate control due to concerns of bradycardia and reinstate as necessary throughout hospitalization 2.  Continue anticoagulation for further risk reduction and stroke with atrial fibrillation with Eliquis   3.  Further intervention or diagnostics from the cardiac standpoint no evidence of myocardial infarction with a troponin elevation consistent with sepsis and no evidence of acute coronary syndrome 4.  Continue supportive care of sepsis without restriction 5.  Begin ambulation and further evaluation and treatment options with adjustments of medication  Signed, Corey Skains M.D. [facility] 02/02/2017, 8:00 AM

## 2017-02-02 NOTE — Progress Notes (Signed)
[facility] NAME: Alexis Barry    MR#:  [id number]  DATE OF BIRTH:  1934/03/20  SUBJECTIVE:  CHIEF COMPLAINT:   Chief Complaint  Patient presents with  . Nausea  . Emesis  . Altered Mental Status  . Hypotension   Patient complains of persistent nausea-uncontrolled with Zofran, Phenergan ordered, no bowel movement for 5 days, patient denies diarrhea REVIEW OF SYSTEMS:  CONSTITUTIONAL: No fever, fatigue or weakness.  EYES: No blurred or double vision.  EARS, NOSE, AND THROAT: No tinnitus or ear pain.  RESPIRATORY: No cough, shortness of breath, wheezing or hemoptysis.  CARDIOVASCULAR: No chest pain, orthopnea, edema.  GASTROINTESTINAL: No nausea, vomiting, diarrhea or abdominal pain.  GENITOURINARY: No dysuria, hematuria.  ENDOCRINE: No polyuria, nocturia,  HEMATOLOGY: No anemia, easy bruising or bleeding SKIN: No rash or lesion. MUSCULOSKELETAL: No joint pain or arthritis.   NEUROLOGIC: No tingling, numbness, weakness.  PSYCHIATRY: No anxiety or depression.   ROS  DRUG ALLERGIES:  No Known Allergies  VITALS:  Blood pressure (!) 118/57, pulse 79, temperature 97.8 F (36.6 C), temperature source Oral, resp. rate 18, height 5\' 7"  (1.702 m), weight 72.6 kg (160 lb), SpO2 97 %.  PHYSICAL EXAMINATION:  GENERAL:  83 y.o.-year-old patient lying in the bed with no acute distress.  Frail appearance EYES: Pupils equal, round, reactive to light and accommodation. No scleral icterus. Extraocular muscles intact.  HEENT: Head atraumatic, normocephalic. Oropharynx and nasopharynx clear.  NECK:  Supple, no jugular venous distention. No thyroid enlargement, no tenderness.  LUNGS: Normal breath sounds bilaterally, no wheezing, rales,rhonchi or crepitation. No use of accessory muscles of respiration.  CARDIOVASCULAR: S1, S2 normal. No murmurs, rubs, or gallops.  ABDOMEN: Soft, nontender, nondistended. Bowel sounds present. No organomegaly  or mass.  EXTREMITIES: Bilateral lower extremity pitting edema, cyanosis, or clubbing.  Diffuse muscular atrophy NEUROLOGIC: Cranial nerves II through XII are intact. MAES. Gait not checked.  PSYCHIATRIC: The patient is alert and oriented x 3.  SKIN: No obvious rash, lesion, or ulcer.   Physical Exam LABORATORY PANEL:   CBC Recent Labs  Lab 02/02/17 0026  WBC 11.1*  HGB 13.4  HCT 40.2  PLT 102*   ------------------------------------------------------------------------------------------------------------------  Chemistries  Recent Labs  Lab 02/01/17 1805 02/02/17 0026  NA  --  124*  K  --  3.3*  CL  --  89*  CO2  --  24  GLUCOSE  --  149*  BUN  --  17  CREATININE 1.56* 1.58*  CALCIUM  --  6.9*  MG 2.0  --   AST  --  251*  ALT  --  49  ALKPHOS  --  291*  BILITOT  --  5.5*   ------------------------------------------------------------------------------------------------------------------  Cardiac Enzymes Recent Labs  Lab 02/01/17 1805 02/02/17 0026  TROPONINI 0.06* 0.05*   ------------------------------------------------------------------------------------------------------------------  RADIOLOGY:  Dg Chest Port 1 View  Result Date: 02/01/2017 CLINICAL DATA:  Weakness, cough EXAM: PORTABLE CHEST 1 VIEW COMPARISON:  01/19/2015 FINDINGS: Large right paratracheal mass has increased since prior study. This was shown on prior CT to represent intrathoracic goiter. There is deviation of the trachea to the left. Cardiomegaly. Small bilateral pleural effusions. Bibasilar atelectasis. IMPRESSION: Large right paratracheal mass, increased in size since prior study. This was shown on prior CT to represent intrathoracic/substernal goiter. Cardiomegaly. Small effusions with bibasilar atelectasis. Electronically Signed   By: Rolm Baptise M.D.   On: 02/01/2017 12:24   US Abdomen Limited Ruq  Result Date: 02/01/2017 CLINICAL DATA:  Elevated LFTs EXAM: ULTRASOUND ABDOMEN LIMITED  RIGHT UPPER QUADRANT COMPARISON:  None. FINDINGS: Gallbladder: 2.6 cm gallstone. No wall thickening or sonographic Murphy's. The gallstone is mobile. Common bile duct: Diameter: Normal diameter, 5 mm Liver: Multiple cysts, the largest 3.6 cm in the right lobe. Small solid-appearing nodules in the right hepatic lobe, measuring 1.3 cm and 9 mm maximally. Diffusely heterogeneous echotexture. Nodular contours of the liver suggest early cirrhosis. Portal vein is patent on color Doppler imaging with normal direction of blood flow towards the liver. IMPRESSION: Abnormal appearance of the liver with markedly heterogeneous echotexture. Nodular contours of the surface suggests cirrhosis. Scattered cystic lesions throughout the liver. Two small possible solid nodules in the liver, the largest 1.3 cm in the right lobe. Cholelithiasis.  No sonographic evidence of acute cholecystitis. Electronically Signed   By: Rolm Baptise M.D.   On: 02/01/2017 16:07    ASSESSMENT AND PLAN:  1 acute sepsis  Unclear etiology. Continue empiric vancomycin and Zosyn, follow-up on cultures, check CT of the chest given Rales at right base   2 acute ? UTI Antibiotics per above and follow-up on cultures  3 acute hypotension  Likely secondary to above  Continue to hold antihypertensives, vitals per routine, make changes as per necessary   4 acute lactic acidosis  Most likely secondary to above  Worsening noted  Continue IV fluids for rehydration and repeat lactic acid level until normal   5 acute lactic acidosis Treatment as above  6 AKI Resolving Most likely secondary to above/sepsis Continue IV fluids for rehydration  7 acute hyponatremia/hypokalemia/hypochloremia Improving Continue IV fluids for rehydration, BMP/magnesium in the morning  8 acute abnormal liver function test Most likely secondary to sepsis and probable cirrhosis Liver ultrasound noted for cirrhosis/liver nodules Check hepatitis panel  9 abnormal  chest x-ray Noted for large right paratracheal mass Patient with known history of large goiter-status post partial resection remotely Check CT of the chest without contrast given acute kidney injury, Rales at right base, and right paratracheal mass  10 acute elevated troponins  Most likely secondary to sepsis  Continue apixaban pharmacy to dose Follow-up with echocardiogram Cardiology input appreciated  11 History of chronic A. Fib Stable on apixaban Cardizem hold to due to low blood pressure and bradycardia  12 chronic benign essential hypertension Currently normotensive Continue to hold Lasix, Cardizem, Lopressor, IV hydralazine as needed systolic blood pressure greater than 160, vitals per routine, make changes as per necessary  13 acute generalized weakness Most likely secondary to sepsis PT to see 1-2 days  14 persistent nausea With no bowel movement in 5 days Check 2 view abdominal series for further evaluation  All the records are reviewed and case discussed with Care Management/Social Workerr. Management plans discussed with the patient, family and they are in agreement.  CODE STATUS: dnr  TOTAL TIME TAKING CARE OF THIS PATIENT: 35 minutes.     POSSIBLE D/C IN 2-5 DAYS, DEPENDING ON CLINICAL CONDITION.   Avel Peace Salary M.D on 02/02/2017   Between 7am to 6pm - Pager - [phone]  After 6pm go to [email or web address] - password [email or web address] [facility]  Office  [phone]  CC: Primary care physician; Lavera Guise, MD  Note: This dictation was prepared with Dragon dictation along with smaller phrase technology. Any transcriptional errors that result from this process are unintentional.

## 2017-02-02 NOTE — Progress Notes (Signed)
Informed Dr. Marcille Blanco of pt. History of CHF and that she has fluids running at 75 along with IV fluids, no new orders at this time. Will continue to monitor pt.

## 2017-02-03 ENCOUNTER — Inpatient Hospital Stay: Payer: Medicare Other

## 2017-02-03 LAB — BASIC METABOLIC PANEL
ANION GAP: 11 (ref 5–15)
BUN: 23 mg/dL — AB (ref 6–20)
CHLORIDE: 90 mmol/L — AB (ref 101–111)
CO2: 21 mmol/L — ABNORMAL LOW (ref 22–32)
Calcium: 7.1 mg/dL — ABNORMAL LOW (ref 8.9–10.3)
Creatinine, Ser: 2.11 mg/dL — ABNORMAL HIGH (ref 0.44–1.00)
GFR calc Af Amer: 24 mL/min — ABNORMAL LOW (ref >60)
GFR, EST NON AFRICAN AMERICAN: 21 mL/min — AB (ref >60)
GLUCOSE: 138 mg/dL — AB (ref 65–99)
POTASSIUM: 4.4 mmol/L (ref 3.5–5.1)
SODIUM: 122 mmol/L — AB (ref 135–145)

## 2017-02-03 LAB — URINE CULTURE: Culture: <10000 — AB

## 2017-02-03 LAB — CBC WITH DIFFERENTIAL/PLATELET
BASOS ABS: 0.1 10*3/uL (ref 0–0.1)
Basophils Relative: 1 %
EOS PCT: 0 %
Eosinophils Absolute: 0 10*3/uL (ref 0–0.7)
HCT: 34.8 % — ABNORMAL LOW (ref 35.0–47.0)
Hemoglobin: 11.7 g/dL — ABNORMAL LOW (ref 12.0–16.0)
LYMPHS ABS: 0.6 10*3/uL — AB (ref 1.0–3.6)
LYMPHS PCT: 5 %
MCH: 30.2 pg (ref 26.0–34.0)
MCHC: 33.7 g/dL (ref 32.0–36.0)
MCV: 89.4 fL (ref 80.0–100.0)
Monocytes Absolute: 0.8 10*3/uL (ref 0.2–0.9)
Monocytes Relative: 8 %
NEUTROS ABS: 9.1 10*3/uL — AB (ref 1.4–6.5)
Neutrophils Relative %: 86 %
PLATELETS: 94 10*3/uL — AB (ref 150–440)
RBC: 3.89 MIL/uL (ref 3.80–5.20)
RDW: 15.7 % — ABNORMAL HIGH (ref 11.5–14.5)
WBC: 10.5 10*3/uL (ref 3.6–11.0)

## 2017-02-03 LAB — LACTIC ACID, PLASMA
LACTIC ACID, VENOUS: 3.9 mmol/L — AB (ref 0.5–1.9)
Lactic Acid, Venous: 5.3 mmol/L (ref 0.5–1.9)

## 2017-02-03 LAB — MAGNESIUM: MAGNESIUM: 1.8 mg/dL (ref 1.7–2.4)

## 2017-02-03 MED ORDER — SODIUM CHLORIDE 1 G PO TABS
2.0000 g | ORAL_TABLET | Freq: Three times a day (TID) | ORAL | Status: DC
  Administered 2017-02-03 – 2017-02-04 (×5): 2 g via ORAL
  Filled 2017-02-03 (×7): qty 2

## 2017-02-03 NOTE — Progress Notes (Signed)
Notified MD Kalisetti via text page regarding critical lactic of 5.3; no new orders received. Nursing staff will continue to monitor for any changes in patient status. Earleen Reaper, RN

## 2017-02-03 NOTE — Progress Notes (Signed)
[facility] Encounter Note  Patient: Alexis Barry / Admit Date: 02/01/2017 / Date of Encounter: 02/03/2017, 7:15 AM   Subjective: Patient breathing and more comfortable today than admission. Heart rate slightly improved at 80 bpm with the previous slow heart rate due to medication management. Cough and congestion improved. No evidence of congestive heart failure or myocardial infarction  Review of Systems: Positive for: Cough congestion Negative for: Vision change, hearing change, syncope, dizziness, nausea, vomiting,diarrhea, bloody stool, stomach pain, positive for cough, congestion, negative for diaphoresis, urinary frequency, urinary pain,skin lesions, skin rashes Others previously listed  Objective: Telemetry: Atrial fibrillation with controlled ventricular rate Physical Exam: Blood pressure 104/71, pulse 89, temperature 98.2 F (36.8 C), temperature source Oral, resp. rate 18, height 5\' 7"  (1.702 m), weight 72.6 kg (160 lb), SpO2 90 %. Body mass index is 25.06 kg/m. General: Well developed, well nourished, in no acute distress. Head: Normocephalic, atraumatic, sclera non-icteric, no xanthomas, nares are without discharge. Neck: No apparent masses Lungs: Normal respirations with some wheezes, few rhonchi, no rales , no crackles   Heart: Irregular rate and rhythm, normal S1 S2, no murmur, no rub, no gallop, PMI is normal size and placement, carotid upstroke normal without bruit, jugular venous pressure normal Abdomen: Soft, non-tender, non-distended with normoactive bowel sounds. No hepatosplenomegaly. Abdominal aorta is normal size without bruit Extremities: No edema, no clubbing, no cyanosis, no ulcers,  Peripheral: 2+ radial, 2+ femoral, 2+ dorsal pedal pulses Neuro: Alert and oriented. Moves all extremities spontaneously. Psych:  Responds to questions appropriately with a normal affect.   Intake/Output Summary (Last 24 hours) at 02/03/2017 0715 Last  data filed at 02/03/2017 7616 Gross per 24 hour  Intake 1112.5 ml  Output 300 ml  Net 812.5 ml    Inpatient Medications:  . apixaban  2.5 mg Oral BID  . sodium chloride flush  3 mL Intravenous Q12H   Infusions:  . 0.9 % NaCl with KCl 40 mEq / L 75 mL/hr (02/03/17 0525)  . piperacillin-tazobactam (ZOSYN)  IV 3.375 g (02/03/17 0524)  . Vancomycin Stopped (02/03/17 0624)    Labs: Recent Labs    02/01/17 1805 02/02/17 0026 02/03/17 0419  NA  --  124* 122*  K  --  3.3* 4.4  CL  --  89* 90*  CO2  --  24 21*  GLUCOSE  --  149* 138*  BUN  --  17 23*  CREATININE 1.56* 1.58* 2.11*  CALCIUM  --  6.9* 7.1*  MG 2.0  --  1.8   Recent Labs    02/01/17 1248 02/02/17 0026  AST 242* 251*  ALT 47 49  ALKPHOS 276* 291*  BILITOT 5.3* 5.5*  PROT 4.5* 4.3*  ALBUMIN 2.4* 2.3*   Recent Labs    02/01/17 1108 02/02/17 0026 02/03/17 0419  WBC 11.2* 11.1* 10.5  NEUTROABS 9.8*  --  9.1*  HGB 14.0 13.4 11.7*  HCT 42.5 40.2 34.8*  MCV 89.6 88.6 89.4  PLT 114* 102* 94*   Recent Labs    02/01/17 1108 02/01/17 1805 02/02/17 0026  TROPONINI 0.08* 0.06* 0.05*   Invalid input(s): POCBNP No results for input(s): HGBA1C in the last 72 hours.   Weights: Filed Weights   02/01/17 1121  Weight: 72.6 kg (160 lb)     Radiology/Studies:  Ct Chest Wo Contrast  Result Date: 02/02/2017 CLINICAL DATA:  Acute sepsis. Rales. Large right paratracheal mass on recent portable chest. EXAM: CT CHEST WITHOUT CONTRAST TECHNIQUE: Multidetector CT  imaging of the chest was performed following the standard protocol without IV contrast. COMPARISON:  Portable chest dated 02/01/2017 and chest CTA dated 05/28/2014. FINDINGS: Cardiovascular: Atheromatous calcifications, including the thoracic aorta and extensive dense coronary artery calcifications. Enlarged heart. Small pericardial effusion with a maximum thickness of 7 mm. Mediastinum/Nodes: Again demonstrated is marked, heterogeneous enlargement of the  right lobe of the thyroid gland extending into the superior mediastinum on the right with progressive associated coarse calcifications. Again demonstrated is a smaller, similar-appearing heterogeneous enlargement of the thyroid gland on the left, extending into the substernal region. These are causing mild compression of the trachea with mild progression. On the right, this measures 9.0 cm in maximum diameter on image number 31 of series 2, previously measuring 9.4 cm in corresponding diameter. There has been a mild increase in size of a mildly prominent lymph node inferior to the enlarged right lobe of the thyroid gland. This is at the level of the carina and measures 11 mm in short axis diameter on image number 57 of series 2, previously 10 mm in corresponding diameter with less density previously. A left precarinal node has a short axis diameter of 10 mm on image number 62 of series 2, previously 11 mm and less dense. Lungs/Pleura: Interval moderate-sized left pleural effusion with mild adjacent left lower lobe atelectasis. There is also an interval small to moderate amount of loculated pleural fluid anteriorly on the right with small medial and lateral compartments in the superior aspect of the right hemithorax. Upper Abdomen: Interval large, laminated gallstone in the inferior gallbladder, measuring 2.8 cm in maximum diameter on image number 162 of series 2, not included in its entirety. Previously demonstrated liver cysts are stable. Interval small to moderate amount of free peritoneal fluid in the upper abdomen. Dense atheromatous arterial calcifications in the upper abdomen. Musculoskeletal: Thoracic spine degenerative changes. Postmastectomy changes on the left. Diffuse subcutaneous edema. IMPRESSION: 1. Very large substernal goiter, remaining larger on the right than on the left. This corresponds to the radiographic mass. 2. Mild superior mediastinal adenopathy with little change, most likely reactive. 3.  Interval moderate-sized left pleural effusion and adjacent mild left lower lobe atelectasis. 4. Interval small to moderate amount of loculated pleural fluid superiorly on the right. 5. Minimal pericardial effusion. 6. Densely calcified coronary artery and aortic atherosclerosis. 7. 2.8 cm large, laminated gallstone in the gallbladder. 8. Interval small to moderate amount of free peritoneal fluid in the upper abdomen. 9. Diffuse anasarca. Aortic Atherosclerosis (ICD10-I70.0). Electronically Signed   By: Claudie Revering M.D.   On: 02/02/2017 15:05   Dg Chest Port 1 View  Result Date: 02/01/2017 CLINICAL DATA:  Weakness, cough EXAM: PORTABLE CHEST 1 VIEW COMPARISON:  01/19/2015 FINDINGS: Large right paratracheal mass has increased since prior study. This was shown on prior CT to represent intrathoracic goiter. There is deviation of the trachea to the left. Cardiomegaly. Small bilateral pleural effusions. Bibasilar atelectasis. IMPRESSION: Large right paratracheal mass, increased in size since prior study. This was shown on prior CT to represent intrathoracic/substernal goiter. Cardiomegaly. Small effusions with bibasilar atelectasis. Electronically Signed   By: Rolm Baptise M.D.   On: 02/01/2017 12:24   Dg Abd 2 Views  Result Date: 02/02/2017 CLINICAL DATA:  Persistent nausea. No bowel movement for the past 5 days. EXAM: ABDOMEN - 2 VIEW COMPARISON:  Portable chest dated 02/01/2017. Chest CT obtained today. FINDINGS: Dense opacity at the left lung base compatible with a combination of atelectasis and pleural fluid seen  on the recent CT. Small amount of pleural fluid on the right. Enlarged cardiac silhouette. Previously demonstrated laminated gallstone in the gallbladder. Normal bowel gas pattern without free peritoneal air. Lumbar and lower thoracic spine degenerative changes. Diffuse osteopenia. IMPRESSION: 1. No acute abdominal abnormality. 2. Cardiomegaly, bilateral pleural fluid and left lower lobe  atelectasis. 3. Cholelithiasis. Electronically Signed   By: Claudie Revering M.D.   On: 02/02/2017 15:07   US Abdomen Limited Ruq  Result Date: 02/01/2017 CLINICAL DATA:  Elevated LFTs EXAM: ULTRASOUND ABDOMEN LIMITED RIGHT UPPER QUADRANT COMPARISON:  None. FINDINGS: Gallbladder: 2.6 cm gallstone. No wall thickening or sonographic Murphy's. The gallstone is mobile. Common bile duct: Diameter: Normal diameter, 5 mm Liver: Multiple cysts, the largest 3.6 cm in the right lobe. Small solid-appearing nodules in the right hepatic lobe, measuring 1.3 cm and 9 mm maximally. Diffusely heterogeneous echotexture. Nodular contours of the liver suggest early cirrhosis. Portal vein is patent on color Doppler imaging with normal direction of blood flow towards the liver. IMPRESSION: Abnormal appearance of the liver with markedly heterogeneous echotexture. Nodular contours of the surface suggests cirrhosis. Scattered cystic lesions throughout the liver. Two small possible solid nodules in the liver, the largest 1.3 cm in the right lobe. Cholelithiasis.  No sonographic evidence of acute cholecystitis. Electronically Signed   By: Rolm Baptise M.D.   On: 02/01/2017 16:07     Assessment and Recommendation  83 y.o. female with known chronic nonvalvular atrial fibrillation essential hypertension with acute significant infection and new onset significant bradycardia multifactorial in nature as well as elevated troponin more consistent with demand ischemia rather than acute coronary syndrome 1. Continue to abstain from diltiazem and metoprolol due to heart rate control without these medications 2. Begin ambulation and follow for need to have reinstatement of beta blocker as necessary for goal heart rate between 60 and 90 bpm 3. Continue anticoagulation with the elequis without change today for further risk reduction in stroke with atrial fibrillation 4. Continue supportive care of infection 5. No further cardiovascular  intervention and/or diagnostics necessary at this time  Signed, Serafina Royals M.D. FACC

## 2017-02-03 NOTE — Progress Notes (Addendum)
Dr. Jerelyn Charles notified via text page of critical lab value of 3.9 lactate.  Dr. Jerelyn Charles notified via text page of pt's swelling, bruising, redness at lab site post phlebotomy stick in RUE.  Arm elevation to be provided at this time per MD.

## 2017-02-03 NOTE — Consult Note (Signed)
Date: 02/03/2017                  Patient Name:  Alexis Barry  MRN: [id number]  DOB: 26-Jan-1934  Age / Sex: 83 y.o., female         PCP: Lavera Guise, MD                 Service Requesting Consult: IM/ Salary, Avel Peace, MD                 Reason for Consult: ARF            History of Present Illness: Patient is a 83 y.o. female with medical problems of atrial fibrillation, mild dementia, history of left breast cancer status post mastectomy treated with chemo and radiation, severe mitral valve regurgitation, moderate tricuspid regurgitation, severe pulmonary hypertension by 2D echo October 2016, who was admitted to [facility] on 02/01/2017 .  She was seen in the urgent care on January 12 and was noted to be hypotensive with blood pressure in the 70s.  She was given 1 L of fluid bolus and sent to the emergency room for evaluation. , She was noted to have severely low sodium of 121, low potassium of 2.4, elevated creatinine at 1.71, low albumin of 2.4, high bilirubin of 5.3.  She also has elevated lactic acid level of 5.2 Chest CT done yesterday shows a large substernal goiter, bilateral pleural effusions, densely calcified coronary arteries and aortic atherosclerosis, 2.8 cm gallstone, diffuse anasarca and ascites  Nephrology consult has been requested to evaluate acute renal failure since her creatinine has worsened to 2.11 today Her baseline creatinine appears to be 0.49 from April 2018  Patient reports that she has had decreased appetite the past week, diarrhea and vomiting. She is ambulatory with a walker. Also admits to lower leg edema and shortness of breath with exertion.   Medications: Outpatient medications: Medications Prior to Admission  Medication Sig Dispense Refill Last Dose  . apixaban (ELIQUIS) 5 MG TABS tablet Take 5 mg by mouth 2 (two) times daily.   01/31/2017 at Unknown time  . diltiazem (CARDIZEM CD) 180 MG 24 hr capsule Take 180 mg by mouth daily.   01/31/2017 at  Unknown time  . furosemide (LASIX) 20 MG tablet Take 40 mg by mouth every morning.   01/31/2017 at Unknown time  . letrozole (FEMARA) 2.5 MG tablet TAKE 1 TABLET(2.5 MG) BY MOUTH DAILY 30 tablet 0 01/31/2017 at Unknown time  . metoprolol (LOPRESSOR) 50 MG tablet Take 50 mg by mouth 2 (two) times daily.   01/31/2017 at Unknown time  . rosuvastatin (CRESTOR) 10 MG tablet Take 10 mg by mouth daily.   01/31/2017 at Unknown time  . acetaminophen (TYLENOL) 500 MG tablet Take 500 mg by mouth every 6 (six) hours as needed.   prn at prn  . alendronate (FOSAMAX) 70 MG tablet TAKE 1 TABLET(70 MG) BY MOUTH 1 TIME A WEEK WITH A FULL GLASS OF WATER AND ON AN EMPTY STOMACH (Patient not taking: Reported on 02/01/2017) 12 tablet 1 Not Taking at Unknown time    Current medications: Current Facility-Administered Medications  Medication Dose Route Frequency Provider Last Rate Last Dose  . acetaminophen (TYLENOL) tablet 650 mg  650 mg Oral Q6H PRN Demetrios Loll, MD       Or  . acetaminophen (TYLENOL) suppository 650 mg  650 mg Rectal Q6H PRN Demetrios Loll, MD      . albuterol (PROVENTIL) (  2.5 MG/3ML) 0.083% nebulizer solution 2.5 mg  2.5 mg Nebulization Q2H PRN Demetrios Loll, MD      . apixaban [facility]) tablet 2.5 mg  2.5 mg Oral BID Pernell Dupre, RPH   2.5 mg at 02/03/17 0846  . bisacodyl (DULCOLAX) EC tablet 5 mg  5 mg Oral Daily PRN Demetrios Loll, MD      . HYDROcodone-acetaminophen (NORCO/VICODIN) 5-325 MG per tablet 1-2 tablet  1-2 tablet Oral Q4H PRN Demetrios Loll, MD      . ondansetron [facility]) tablet 4 mg  4 mg Oral Q6H PRN Demetrios Loll, MD       Or  . ondansetron [facility]) injection 4 mg  4 mg Intravenous Q6H PRN Demetrios Loll, MD   4 mg at 02/02/17 0950  . promethazine (PHENERGAN) injection 6.25 mg  6.25 mg Intravenous Q6H PRN Salary, Montell D, MD      . senna-docusate (Senokot-S) tablet 1 tablet  1 tablet Oral QHS PRN Demetrios Loll, MD      . sodium chloride flush (NS) 0.9 % injection 3 mL  3 mL Intravenous Q12H Demetrios Loll,  MD   3 mL at 02/03/17 0846  . sodium chloride tablet 2 g  2 g Oral TID WC Salary, Montell D, MD   2 g at 02/03/17 1331      Allergies: No Known Allergies    Past Medical History: Past Medical History:  Diagnosis Date  . Atrial fibrillation ([place])   . Breast cancer ([place]) 08/21/12   left mastectomy  . Cancer [facility])    Left breast cancer s/p left mastectomy, chemo and radiation  . CHF (congestive heart failure) ([place])   . Goiter   . Hypertension   . Mild dementia   . Personal history of chemotherapy   . Personal history of radiation therapy      Past Surgical History: Past Surgical History:  Procedure Laterality Date  . BREAST SURGERY     Left mastectomy  . MASTECTOMY Left 08/21/12   had chemo and rad  . THYROID SURGERY  1977     Family History: Family History  Problem Relation Age of Onset  . Breast cancer Maternal Aunt   . CAD Father      Social History: Social History   Socioeconomic History  . Marital status: Widowed    Spouse name: Not on file  . Number of children: Not on file  . Years of education: Not on file  . Highest education level: Not on file  Social Needs  . Financial resource strain: Not on file  . Food insecurity - worry: Not on file  . Food insecurity - inability: Not on file  . Transportation needs - medical: Not on file  . Transportation needs - non-medical: Not on file  Occupational History  . Not on file  Tobacco Use  . Smoking status: Never Smoker  . Smokeless tobacco: Never Used  Substance and Sexual Activity  . Alcohol use: No  . Drug use: No  . Sexual activity: Not on file  Other Topics Concern  . Not on file  Social History Narrative  . Not on file     Review of Systems: Gen: Fatigue. No fevers/chills  Skin: Jaundice  HEENT:  No complaints  CV: a. Fib no chest pain.  Resp: shortness of breath. No cough/sputum  GI: decreased appetite. Vomiting, diarrhea.  GU : No dysuria/ hematuria   MS: No Complaints. Able to  ambulate with walker  Derm:  No  Complaints  Psych: No Complaints   Heme: No Complaints  Neuro: No Complaints  Endocrine: No Complaints   Vital Signs: Blood pressure 130/65, pulse 93, temperature 97.7 F (36.5 C), temperature source Oral, resp. rate 18, height 5\' 7"  (1.702 m), weight 72.2 kg (159 lb 2.8 oz), SpO2 93 %.   Intake/Output Summary (Last 24 hours) at 02/03/2017 1505 Last data filed at 02/03/2017 0956 Gross per 24 hour  Intake 1352.5 ml  Output 200 ml  Net 1152.5 ml    Weight trends: Filed Weights   02/01/17 1121 02/03/17 0900  Weight: 72.6 kg (160 lb) 72.2 kg (159 lb 2.8 oz)    Physical Exam: General:  Patient is lying in bed.   HEENT  Normocephalic, atraumatic. Dry mucous membranes  Neck:  No distended neck vein  Lungs: Clear to auscultation   Heart::  Irregularly irregular. Prominent systolic murmur   Abdomen:   Soft. Mild tenderness over suprapubic area.   Extremities:  dependent edema bilaterally. Wearing TED hose   Neurologic: Alert and Oriented   Skin: Skin appears jaundice             Lab results: Basic Metabolic Panel: Recent Labs  Lab 02/01/17 1248 02/01/17 1805 02/02/17 0026 02/03/17 0419  NA 121*  --  124* 122*  K 2.4*  --  3.3* 4.4  CL 84*  --  89* 90*  CO2 26  --  24 21*  GLUCOSE 182*  --  149* 138*  BUN 18  --  17 23*  CREATININE 1.71* 1.56* 1.58* 2.11*  CALCIUM 7.1*  --  6.9* 7.1*  MG  --  2.0  --  1.8    Liver Function Tests: Recent Labs  Lab 02/02/17 0026  AST 251*  ALT 49  ALKPHOS 291*  BILITOT 5.5*  PROT 4.3*  ALBUMIN 2.3*   No results for input(s): LIPASE, AMYLASE in the last 168 hours. No results for input(s): AMMONIA in the last 168 hours.  CBC: Recent Labs  Lab 02/01/17 1108 02/02/17 0026 02/03/17 0419  WBC 11.2* 11.1* 10.5  NEUTROABS 9.8*  --  9.1*  HGB 14.0 13.4 11.7*  HCT 42.5 40.2 34.8*  MCV 89.6 88.6 89.4  PLT 114* 102* 94*    Cardiac Enzymes: Recent Labs  Lab 02/02/17 0026  TROPONINI 0.05*     BNP: Invalid input(s): POCBNP  CBG: No results for input(s): GLUCAP in the last 168 hours.  Microbiology: Recent Results (from the past 720 hour(s))  Culture, blood (routine x 2)     Status: None (Preliminary result)   Collection Time: 02/01/17 11:08 AM  Result Value Ref Range Status   Specimen Description BLOOD NECK LEFT  Final   Special Requests   Final    BOTTLES DRAWN AEROBIC AND ANAEROBIC Blood Culture adequate volume   Culture   Final    NO GROWTH 2 DAYS Performed at [facility], [address] [place], [place] [address]    Report Status PENDING  Incomplete  Urine culture     Status: Abnormal   Collection Time: 02/01/17 11:29 AM  Result Value Ref Range Status   Specimen Description   Final    URINE, RANDOM Performed at [facility], [address], [place], [place] [address]    Special Requests   Final    NONE Performed at [facility], [address], [place], [place] [address]    Culture (A)  Final    <10,000 COLONIES/mL INSIGNIFICANT GROWTH Performed at  [facility], [place] [address], [place], [place] [address]    Report Status 02/03/2017 FINAL  Final  Culture, blood (routine x 2)     Status: None (Preliminary result)   Collection Time: 02/01/17  5:42 PM  Result Value Ref Range Status   Specimen Description BLOOD RIGHT ANTECUBITAL  Final   Special Requests   Final    BOTTLES DRAWN AEROBIC AND ANAEROBIC Blood Culture results may not be optimal due to an excessive volume of blood received in culture bottles   Culture   Final    NO GROWTH 2 DAYS Performed at [facility], [address] [place], [place] [address]    Report Status PENDING  Incomplete     Coagulation Studies: Recent Labs    02/01/17 1805  LABPROT 38.8*  INR 4.05*    Urinalysis: Recent Labs    02/01/17 1129  COLORURINE AMBER*  LABSPEC 1.023  PHURINE 5.0  GLUCOSEU NEGATIVE  HGBUR MODERATE*  BILIRUBINUR NEGATIVE  KETONESUR NEGATIVE   PROTEINUR 100*  NITRITE NEGATIVE  LEUKOCYTESUR LARGE*        Imaging: Ct Chest Wo Contrast  Result Date: 02/02/2017 CLINICAL DATA:  Acute sepsis. Rales. Large right paratracheal mass on recent portable chest. EXAM: CT CHEST WITHOUT CONTRAST TECHNIQUE: Multidetector CT imaging of the chest was performed following the standard protocol without IV contrast. COMPARISON:  Portable chest dated 02/01/2017 and chest CTA dated 05/28/2014. FINDINGS: Cardiovascular: Atheromatous calcifications, including the thoracic aorta and extensive dense coronary artery calcifications. Enlarged heart. Small pericardial effusion with a maximum thickness of 7 mm. Mediastinum/Nodes: Again demonstrated is marked, heterogeneous enlargement of the right lobe of the thyroid gland extending into the superior mediastinum on the right with progressive associated coarse calcifications. Again demonstrated is a smaller, similar-appearing heterogeneous enlargement of the thyroid gland on the left, extending into the substernal region. These are causing mild compression of the trachea with mild progression. On the right, this measures 9.0 cm in maximum diameter on image number 31 of series 2, previously measuring 9.4 cm in corresponding diameter. There has been a mild increase in size of a mildly prominent lymph node inferior to the enlarged right lobe of the thyroid gland. This is at the level of the carina and measures 11 mm in short axis diameter on image number 57 of series 2, previously 10 mm in corresponding diameter with less density previously. A left precarinal node has a short axis diameter of 10 mm on image number 62 of series 2, previously 11 mm and less dense. Lungs/Pleura: Interval moderate-sized left pleural effusion with mild adjacent left lower lobe atelectasis. There is also an interval small to moderate amount of loculated pleural fluid anteriorly on the right with small medial and lateral compartments in the superior  aspect of the right hemithorax. Upper Abdomen: Interval large, laminated gallstone in the inferior gallbladder, measuring 2.8 cm in maximum diameter on image number 162 of series 2, not included in its entirety. Previously demonstrated liver cysts are stable. Interval small to moderate amount of free peritoneal fluid in the upper abdomen. Dense atheromatous arterial calcifications in the upper abdomen. Musculoskeletal: Thoracic spine degenerative changes. Postmastectomy changes on the left. Diffuse subcutaneous edema. IMPRESSION: 1. Very large substernal goiter, remaining larger on the right than on the left. This corresponds to the radiographic mass. 2. Mild superior mediastinal adenopathy with little change, most likely reactive. 3. Interval moderate-sized left pleural effusion and adjacent mild left lower lobe atelectasis. 4. Interval small to moderate  amount of loculated pleural fluid superiorly on the right. 5. Minimal pericardial effusion. 6. Densely calcified coronary artery and aortic atherosclerosis. 7. 2.8 cm large, laminated gallstone in the gallbladder. 8. Interval small to moderate amount of free peritoneal fluid in the upper abdomen. 9. Diffuse anasarca. Aortic Atherosclerosis (ICD10-I70.0). Electronically Signed   By: Claudie Revering M.D.   On: 02/02/2017 15:05   Dg Abd 2 Views  Result Date: 02/02/2017 CLINICAL DATA:  Persistent nausea. No bowel movement for the past 5 days. EXAM: ABDOMEN - 2 VIEW COMPARISON:  Portable chest dated 02/01/2017. Chest CT obtained today. FINDINGS: Dense opacity at the left lung base compatible with a combination of atelectasis and pleural fluid seen on the recent CT. Small amount of pleural fluid on the right. Enlarged cardiac silhouette. Previously demonstrated laminated gallstone in the gallbladder. Normal bowel gas pattern without free peritoneal air. Lumbar and lower thoracic spine degenerative changes. Diffuse osteopenia. IMPRESSION: 1. No acute abdominal  abnormality. 2. Cardiomegaly, bilateral pleural fluid and left lower lobe atelectasis. 3. Cholelithiasis. Electronically Signed   By: Claudie Revering M.D.   On: 02/02/2017 15:07   US Abdomen Limited Ruq  Addendum Date: 02/03/2017   ADDENDUM REPORT: 02/03/2017 09:32 ADDENDUM: Correction in the initial report. Blood flow in the portal vein is away from the liver, hepatofugal. Electronically Signed   By: Rolm Baptise M.D.   On: 02/03/2017 09:32   Result Date: 02/03/2017 CLINICAL DATA:  Elevated LFTs EXAM: ULTRASOUND ABDOMEN LIMITED RIGHT UPPER QUADRANT COMPARISON:  None. FINDINGS: Gallbladder: 2.6 cm gallstone. No wall thickening or sonographic Murphy's. The gallstone is mobile. Common bile duct: Diameter: Normal diameter, 5 mm Liver: Multiple cysts, the largest 3.6 cm in the right lobe. Small solid-appearing nodules in the right hepatic lobe, measuring 1.3 cm and 9 mm maximally. Diffusely heterogeneous echotexture. Nodular contours of the liver suggest early cirrhosis. Portal vein is patent on color Doppler imaging with normal direction of blood flow towards the liver. IMPRESSION: Abnormal appearance of the liver with markedly heterogeneous echotexture. Nodular contours of the surface suggests cirrhosis. Scattered cystic lesions throughout the liver. Two small possible solid nodules in the liver, the largest 1.3 cm in the right lobe. Cholelithiasis.  No sonographic evidence of acute cholecystitis. Electronically Signed: By: Rolm Baptise M.D. On: 02/01/2017 16:07      Assessment & Plan: Pt is a 83 y.o.  caucasian female with a. Fib, mild dementia, history of left breast cancer status post mastectomy treated with chemo and radiation, severe mitral valve regurgitation, moderate tricuspid regurgitation, severe pulmonary hypertension by 2D echo October 2016, was admitted on 02/01/2017 with hypotension, diarrhea, nausea and vomiting was found to have acute renal failure and hyponatremia.   1. Acute Renal Failure  (baseline creatinine 0.49 in April 2018 ). Patient could have intravascular depletion due to vomiting and diarrhea leading to ATN. Renal ultrasound is neg for obstruction.  Avoid IV fluids at this time since blood pressure is in an acceptable range. Avoid hypotension.   2. Hyponatremia- chronic with recent worsening. Could be related to cirrhosis with recent worsening due to vomiting and diarrhea.   Obtain urine electrolytes.   3. Anasarca due to hypoalbuminemia and third spacing of fluid. Will consider IV albumin supplementation in near future.   4. Urinary Tract Infection. Antibiotics as per hospital team.   Consider a palliative care consult as well.

## 2017-02-03 NOTE — Plan of Care (Signed)
Pt is drowsy, oriented x4. VSS . RA . Family at bedside earlier in shift. IV antibiotics continued per orders. OOB with assist. No complaints thus far. Will continue to monitor and report to oncoming RN .  Progressing Education: Knowledge of General Education information will improve 02/03/2017 1340 - Progressing by Aleen Campi, RN Health Behavior/Discharge Planning: Ability to manage health-related needs will improve 02/03/2017 1340 - Progressing by Aleen Campi, RN Clinical Measurements: Ability to maintain clinical measurements within normal limits will improve 02/03/2017 1340 - Progressing by Aleen Campi, RN Will remain free from infection 02/03/2017 1340 - Progressing by Aleen Campi, RN Diagnostic test results will improve 02/03/2017 1340 - Progressing by Aleen Campi, RN Respiratory complications will improve 02/03/2017 1340 - Progressing by Aleen Campi, RN Cardiovascular complication will be avoided 02/03/2017 1340 - Progressing by Aleen Campi, RN Activity: Risk for activity intolerance will decrease 02/03/2017 1340 - Progressing by Aleen Campi, RN Nutrition: Adequate nutrition will be maintained 02/03/2017 1340 - Progressing by Aleen Campi, RN Coping: Level of anxiety will decrease 02/03/2017 1340 - Progressing by Aleen Campi, RN Elimination: Will not experience complications related to bowel motility 02/03/2017 1340 - Progressing by Aleen Campi, RN Will not experience complications related to urinary retention 02/03/2017 1340 - Progressing by Aleen Campi, RN Pain Managment: General experience of comfort will improve 02/03/2017 1340 - Progressing by Aleen Campi, RN Safety: Ability to remain free from injury will improve 02/03/2017 1340 - Progressing by Aleen Campi, RN Skin Integrity: Risk for impaired skin integrity will decrease 02/03/2017 1340 - Progressing by Aleen Campi, RN Fluid Volume: Hemodynamic stability will improve 02/03/2017 1340 -  Progressing by Aleen Campi, RN

## 2017-02-03 NOTE — Progress Notes (Signed)
[facility] [place] NAME: Alexis Barry    MR#:  [id number]  DATE OF BIRTH:  11-02-1934  SUBJECTIVE:  CHIEF COMPLAINT:   Chief Complaint  Patient presents with  . Nausea  . Emesis  . Altered Mental Status  . Hypotension  Patient complaining of generalized weakness/fatigue only, numerous family members at the bedside, cultures results are negative-to discontinue antibiotics, gastroenterology to see for cirrhosis on ultrasound, salt tabs for hyponatremia, follow-up on hepatitis panel, physical therapy evaluation, cardiology note reviewed  REVIEW OF SYSTEMS:  CONSTITUTIONAL: No fever, fatigue or weakness.  EYES: No blurred or double vision.  EARS, NOSE, AND THROAT: No tinnitus or ear pain.  RESPIRATORY: No cough, shortness of breath, wheezing or hemoptysis.  CARDIOVASCULAR: No chest pain, orthopnea, edema.  GASTROINTESTINAL: No nausea, vomiting, diarrhea or abdominal pain.  GENITOURINARY: No dysuria, hematuria.  ENDOCRINE: No polyuria, nocturia,  HEMATOLOGY: No anemia, easy bruising or bleeding SKIN: No rash or lesion. MUSCULOSKELETAL: No joint pain or arthritis.   NEUROLOGIC: No tingling, numbness, weakness.  PSYCHIATRY: No anxiety or depression.   ROS  DRUG ALLERGIES:  No Known Allergies  VITALS:  Blood pressure 130/65, pulse 93, temperature 97.7 F (36.5 C), temperature source Oral, resp. rate 18, height 5\' 7"  (1.702 m), weight 72.2 kg (159 lb 2.8 oz), SpO2 93 %.  PHYSICAL EXAMINATION:  GENERAL:  83 y.o.-year-old patient lying in the bed with no acute distress.  EYES: Pupils equal, round, reactive to light and accommodation. No scleral icterus. Extraocular muscles intact.  HEENT: Head atraumatic, normocephalic. Oropharynx and nasopharynx clear.  NECK:  Supple, no jugular venous distention. No thyroid enlargement, no tenderness.  LUNGS: Normal breath sounds bilaterally, no wheezing, rales,rhonchi or crepitation. No use of accessory  muscles of respiration.  CARDIOVASCULAR: S1, S2 normal. No murmurs, rubs, or gallops.  ABDOMEN: Soft, nontender, nondistended. Bowel sounds present. No organomegaly or mass.  EXTREMITIES: No pedal edema, cyanosis, or clubbing.  NEUROLOGIC: Cranial nerves II through XII are intact. Muscle strength 5/5 in all extremities. Sensation intact. Gait not checked.  PSYCHIATRIC: The patient is alert and oriented x 3.  SKIN: No obvious rash, lesion, or ulcer.   Physical Exam LABORATORY PANEL:   CBC Recent Labs  Lab 02/03/17 0419  WBC 10.5  HGB 11.7*  HCT 34.8*  PLT 94*   ------------------------------------------------------------------------------------------------------------------  Chemistries  Recent Labs  Lab 02/02/17 0026 02/03/17 0419  NA 124* 122*  K 3.3* 4.4  CL 89* 90*  CO2 24 21*  GLUCOSE 149* 138*  BUN 17 23*  CREATININE 1.58* 2.11*  CALCIUM 6.9* 7.1*  MG  --  1.8  AST 251*  --   ALT 49  --   ALKPHOS 291*  --   BILITOT 5.5*  --    ------------------------------------------------------------------------------------------------------------------  Cardiac Enzymes Recent Labs  Lab 02/01/17 1805 02/02/17 0026  TROPONINI 0.06* 0.05*   ------------------------------------------------------------------------------------------------------------------  RADIOLOGY:  Ct Chest Wo Contrast  Result Date: 02/02/2017 CLINICAL DATA:  Acute sepsis. Rales. Large right paratracheal mass on recent portable chest. EXAM: CT CHEST WITHOUT CONTRAST TECHNIQUE: Multidetector CT imaging of the chest was performed following the standard protocol without IV contrast. COMPARISON:  Portable chest dated 02/01/2017 and chest CTA dated 05/28/2014. FINDINGS: Cardiovascular: Atheromatous calcifications, including the thoracic aorta and extensive dense coronary artery calcifications. Enlarged heart. Small pericardial effusion with a maximum thickness of 7 mm. Mediastinum/Nodes: Again demonstrated is  marked, heterogeneous enlargement of the right lobe of the thyroid gland extending into  the superior mediastinum on the right with progressive associated coarse calcifications. Again demonstrated is a smaller, similar-appearing heterogeneous enlargement of the thyroid gland on the left, extending into the substernal region. These are causing mild compression of the trachea with mild progression. On the right, this measures 9.0 cm in maximum diameter on image number 31 of series 2, previously measuring 9.4 cm in corresponding diameter. There has been a mild increase in size of a mildly prominent lymph node inferior to the enlarged right lobe of the thyroid gland. This is at the level of the carina and measures 11 mm in short axis diameter on image number 57 of series 2, previously 10 mm in corresponding diameter with less density previously. A left precarinal node has a short axis diameter of 10 mm on image number 62 of series 2, previously 11 mm and less dense. Lungs/Pleura: Interval moderate-sized left pleural effusion with mild adjacent left lower lobe atelectasis. There is also an interval small to moderate amount of loculated pleural fluid anteriorly on the right with small medial and lateral compartments in the superior aspect of the right hemithorax. Upper Abdomen: Interval large, laminated gallstone in the inferior gallbladder, measuring 2.8 cm in maximum diameter on image number 162 of series 2, not included in its entirety. Previously demonstrated liver cysts are stable. Interval small to moderate amount of free peritoneal fluid in the upper abdomen. Dense atheromatous arterial calcifications in the upper abdomen. Musculoskeletal: Thoracic spine degenerative changes. Postmastectomy changes on the left. Diffuse subcutaneous edema. IMPRESSION: 1. Very large substernal goiter, remaining larger on the right than on the left. This corresponds to the radiographic mass. 2. Mild superior mediastinal adenopathy with  little change, most likely reactive. 3. Interval moderate-sized left pleural effusion and adjacent mild left lower lobe atelectasis. 4. Interval small to moderate amount of loculated pleural fluid superiorly on the right. 5. Minimal pericardial effusion. 6. Densely calcified coronary artery and aortic atherosclerosis. 7. 2.8 cm large, laminated gallstone in the gallbladder. 8. Interval small to moderate amount of free peritoneal fluid in the upper abdomen. 9. Diffuse anasarca. Aortic Atherosclerosis (ICD10-I70.0). Electronically Signed   By: Claudie Revering M.D.   On: 02/02/2017 15:05   Dg Abd 2 Views  Result Date: 02/02/2017 CLINICAL DATA:  Persistent nausea. No bowel movement for the past 5 days. EXAM: ABDOMEN - 2 VIEW COMPARISON:  Portable chest dated 02/01/2017. Chest CT obtained today. FINDINGS: Dense opacity at the left lung base compatible with a combination of atelectasis and pleural fluid seen on the recent CT. Small amount of pleural fluid on the right. Enlarged cardiac silhouette. Previously demonstrated laminated gallstone in the gallbladder. Normal bowel gas pattern without free peritoneal air. Lumbar and lower thoracic spine degenerative changes. Diffuse osteopenia. IMPRESSION: 1. No acute abdominal abnormality. 2. Cardiomegaly, bilateral pleural fluid and left lower lobe atelectasis. 3. Cholelithiasis. Electronically Signed   By: Claudie Revering M.D.   On: 02/02/2017 15:07   US Abdomen Limited Ruq  Addendum Date: 02/03/2017   ADDENDUM REPORT: 02/03/2017 09:32 ADDENDUM: Correction in the initial report. Blood flow in the portal vein is away from the liver, hepatofugal. Electronically Signed   By: Rolm Baptise M.D.   On: 02/03/2017 09:32   Result Date: 02/03/2017 CLINICAL DATA:  Elevated LFTs EXAM: ULTRASOUND ABDOMEN LIMITED RIGHT UPPER QUADRANT COMPARISON:  None. FINDINGS: Gallbladder: 2.6 cm gallstone. No wall thickening or sonographic Murphy's. The gallstone is mobile. Common bile duct: Diameter:  Normal diameter, 5 mm Liver: Multiple cysts, the largest 3.6  cm in the right lobe. Small solid-appearing nodules in the right hepatic lobe, measuring 1.3 cm and 9 mm maximally. Diffusely heterogeneous echotexture. Nodular contours of the liver suggest early cirrhosis. Portal vein is patent on color Doppler imaging with normal direction of blood flow towards the liver. IMPRESSION: Abnormal appearance of the liver with markedly heterogeneous echotexture. Nodular contours of the surface suggests cirrhosis. Scattered cystic lesions throughout the liver. Two small possible solid nodules in the liver, the largest 1.3 cm in the right lobe. Cholelithiasis.  No sonographic evidence of acute cholecystitis. Electronically Signed: By: Rolm Baptise M.D. On: 02/01/2017 16:07    ASSESSMENT AND PLAN:  1 acute sepsis  Ruled out Discontinue empiric antibiotics   2 acute possible UTI  Urine culture negative  Discontinued empiric antibiotics   3 acute hypotension  Resolved Etiology unclear Normotensive currently Vitals per routine and make changes as per necessary   4 acute lactic acidosis  Etiology unknown Repeat lactic acid level until normal, received IV fluids for rehydration  5 AKI Worsening noted check renal ultrasound, avoid nephrotoxic agents, BMP daily, and consult nephrology for expert opinion  6 acute hyponatremia/hypokalemia/hypochloremia Salt tabs 3 times daily, potassium repleted Check BMP in the morning  7 acute abnormal liver function test Most likely secondary to cirrhosis Liver ultrasound noted for cirrhosis/liver nodules Follow-up on hepatitis panel, CMP in the morning, consult gastroenterology for expert opinion, avoid hepatotoxic agents  8 chronic paratracheal mass Secondary to partially resected large goiter Stable Conservative observation/management CT/chest x-ray noted  TSH normal  9 acute elevated troponins  Continue apixaban  Cardiology input appreciated-no  further intervention/workup necessary per cardiology  10 History of chronic A. Fib Stable on apixaban Cardizem/beta-blocker therapy held due to relative low normal blood pressures  11 chronic benign essential hypertension Currently normotensive off antihypertensives Continue current regiment  Continue to hold Lasix, Cardizem, Lopressor, IV hydralazine as needed systolic blood pressure greater than 160, vitals per routine, and make changes as per necessary  12 acute generalized weakness Most likely secondary to chronic deconditioning  Physical therapy to see   13 persistent nausea Acute abdominal series unremarkable  Continue antiemetics PRN   All the records are reviewed and case discussed with Care Management/Social Workerr. Management plans discussed with the patient, family and they are in agreement.  CODE STATUS: dnr   TOTAL TIME TAKING CARE OF THIS PATIENT: 40 minutes.     POSSIBLE D/C IN 2-3 DAYS, DEPENDING ON CLINICAL CONDITION.   Avel Peace Everado Pillsbury M.D on 02/03/2017   Between 7am to 6pm - Pager - [phone]  After 6pm go to [email or web address] - password [email or web address] [facility]  Office  [phone]  CC: Primary care physician; Lavera Guise, MD  Note: This dictation was prepared with Dragon dictation along with smaller phrase technology. Any transcriptional errors that result from this process are unintentional.

## 2017-02-04 ENCOUNTER — Inpatient Hospital Stay
Admit: 2017-02-04 | Discharge: 2017-02-04 | Disposition: A | Discharge status: Home / Self Care | Payer: Medicare Other | Attending: Family Medicine | Admitting: Family Medicine

## 2017-02-04 DIAGNOSIS — R945 Abnormal results of liver function studies: Secondary | ICD-10-CM

## 2017-02-04 DIAGNOSIS — K7469 Other cirrhosis of liver: Secondary | ICD-10-CM

## 2017-02-04 LAB — HEPATITIS PANEL, ACUTE
HEP A IGM: NEGATIVE
HEP B S AG: NEGATIVE
Hep B C IgM: NEGATIVE

## 2017-02-04 LAB — HEPATIC FUNCTION PANEL
ALBUMIN: 2.1 g/dL — AB (ref 3.5–5.0)
ALT: 51 U/L (ref 14–54)
AST: 322 U/L — ABNORMAL HIGH (ref 15–41)
Alkaline Phosphatase: 340 U/L — ABNORMAL HIGH (ref 38–126)
Bilirubin, Direct: 3.6 mg/dL — ABNORMAL HIGH (ref 0.1–0.5)
Indirect Bilirubin: 2.8 mg/dL — ABNORMAL HIGH (ref 0.3–0.9)
TOTAL PROTEIN: 4 g/dL — AB (ref 6.5–8.1)
Total Bilirubin: 6.4 mg/dL — ABNORMAL HIGH (ref 0.3–1.2)

## 2017-02-04 LAB — FERRITIN: Ferritin: 3641 ng/mL — ABNORMAL HIGH (ref 11–307)

## 2017-02-04 LAB — BASIC METABOLIC PANEL
Anion gap: 12 (ref 5–15)
BUN: 28 mg/dL — ABNORMAL HIGH (ref 6–20)
CALCIUM: 7 mg/dL — AB (ref 8.9–10.3)
CO2: 19 mmol/L — ABNORMAL LOW (ref 22–32)
Chloride: 89 mmol/L — ABNORMAL LOW (ref 101–111)
Creatinine, Ser: 2.24 mg/dL — ABNORMAL HIGH (ref 0.44–1.00)
GFR, EST AFRICAN AMERICAN: 23 mL/min — AB (ref >60)
GFR, EST NON AFRICAN AMERICAN: 20 mL/min — AB (ref >60)
Glucose, Bld: 118 mg/dL — ABNORMAL HIGH (ref 65–99)
Potassium: 4.6 mmol/L (ref 3.5–5.1)
Sodium: 120 mmol/L — ABNORMAL LOW (ref 135–145)

## 2017-02-04 LAB — OSMOLALITY: Osmolality: 266 mOsm/kg — ABNORMAL LOW (ref 275–295)

## 2017-02-04 LAB — ACETAMINOPHEN LEVEL

## 2017-02-04 LAB — URINALYSIS, ROUTINE W REFLEX MICROSCOPIC
BACTERIA UA: NONE SEEN
Bilirubin Urine: NEGATIVE
Glucose, UA: NEGATIVE mg/dL
Ketones, ur: NEGATIVE mg/dL
Nitrite: NEGATIVE
PROTEIN: 100 mg/dL — AB
SPECIFIC GRAVITY, URINE: 1.018 (ref 1.005–1.030)
pH: 5 (ref 5.0–8.0)

## 2017-02-04 LAB — SODIUM
SODIUM: 121 mmol/L — AB (ref 135–145)
SODIUM: 125 mmol/L — AB (ref 135–145)
Sodium: 121 mmol/L — ABNORMAL LOW (ref 135–145)

## 2017-02-04 LAB — URINE DRUG SCREEN, QUALITATIVE (ARMC ONLY)
Amphetamines, Ur Screen: NOT DETECTED
BARBITURATES, UR SCREEN: NOT DETECTED
BENZODIAZEPINE, UR SCRN: NOT DETECTED
CANNABINOID 50 NG, UR ~~LOC~~: NOT DETECTED
Cocaine Metabolite,Ur ~~LOC~~: NOT DETECTED
MDMA (Ecstasy)Ur Screen: NOT DETECTED
Methadone Scn, Ur: NOT DETECTED
Opiate, Ur Screen: NOT DETECTED
PHENCYCLIDINE (PCP) UR S: NOT DETECTED
TRICYCLIC, UR SCREEN: NOT DETECTED

## 2017-02-04 LAB — OSMOLALITY, URINE: OSMOLALITY UR: 377 mosm/kg (ref 300–900)

## 2017-02-04 LAB — SODIUM, URINE, RANDOM: Sodium, Ur: <10 mmol/L

## 2017-02-04 LAB — CHLORIDE, URINE, RANDOM: CHLORIDE URINE: 31 mmol/L

## 2017-02-04 MED ORDER — HEPARIN SODIUM (PORCINE) 5000 UNIT/ML IJ SOLN
5000.0000 [IU] | Freq: Three times a day (TID) | INTRAMUSCULAR | Status: DC
  Administered 2017-02-04 – 2017-02-06 (×5): 5000 [IU] via SUBCUTANEOUS
  Filled 2017-02-04 (×5): qty 1

## 2017-02-04 MED ORDER — LACTULOSE 10 GM/15ML PO SOLN
30.0000 g | Freq: Two times a day (BID) | ORAL | Status: DC
  Administered 2017-02-04 – 2017-02-05 (×3): 30 g via ORAL
  Filled 2017-02-04 (×3): qty 60

## 2017-02-04 MED ORDER — SODIUM CHLORIDE 3 % IV SOLN
INTRAVENOUS | Status: DC
  Administered 2017-02-04 – 2017-02-05 (×3): 50 mL/h via INTRAVENOUS
  Filled 2017-02-04 (×6): qty 500

## 2017-02-04 NOTE — Plan of Care (Signed)
Pt is very weak, two person assist from bed to chair.

## 2017-02-04 NOTE — Plan of Care (Signed)
Care plan reviewed.

## 2017-02-04 NOTE — Plan of Care (Addendum)
Pt is A&Ox4. VSS. RA . OOB with standby assist. IVF infusing per order. Family at bedside earlier in shift. No complaints thus far. Pt remains Afib on monitor. Will continue to monitor and report to oncoming RN .  Progressing Education: Knowledge of General Education information will improve 02/04/2017 1346 - Progressing by Aleen Campi, RN Health Behavior/Discharge Planning: Ability to manage health-related needs will improve 02/04/2017 1346 - Progressing by Aleen Campi, RN Clinical Measurements: Ability to maintain clinical measurements within normal limits will improve 02/04/2017 1346 - Progressing by Aleen Campi, RN Will remain free from infection 02/04/2017 1346 - Progressing by Aleen Campi, RN Diagnostic test results will improve 02/04/2017 1346 - Progressing by Aleen Campi, RN Respiratory complications will improve 02/04/2017 1346 - Progressing by Aleen Campi, RN Cardiovascular complication will be avoided 02/04/2017 1346 - Progressing by Aleen Campi, RN Activity: Risk for activity intolerance will decrease 02/04/2017 1346 - Progressing by Aleen Campi, RN Nutrition: Adequate nutrition will be maintained 02/04/2017 1346 - Progressing by Aleen Campi, RN Coping: Level of anxiety will decrease 02/04/2017 1346 - Progressing by Aleen Campi, RN Elimination: Will not experience complications related to bowel motility 02/04/2017 1346 - Progressing by Aleen Campi, RN Will not experience complications related to urinary retention 02/04/2017 1346 - Progressing by Aleen Campi, RN Pain Managment: General experience of comfort will improve 02/04/2017 1346 - Progressing by Aleen Campi, RN Safety: Ability to remain free from injury will improve 02/04/2017 1346 - Progressing by Aleen Campi, RN Skin Integrity: Risk for impaired skin integrity will decrease 02/04/2017 1346 - Progressing by Aleen Campi, RN Fluid Volume: Hemodynamic stability will improve 02/04/2017 1346 -  Progressing by Aleen Campi, RN

## 2017-02-04 NOTE — Progress Notes (Signed)
[facility], [place] 02/04/17  Subjective:   Patient has a decreased appetite. She denies any SOB.  Denies diarrhea and vomiting.  Na level is worse today at 120 3% saline started by hospitalist team Urine electrolytes are still pending  Objective:  Vital signs in last 24 hours:  Temp:  [98.1 F (36.7 C)-98.8 F (37.1 C)] 98.8 F (37.1 C) (01/15 0746) Pulse Rate:  [89-108] 102 (01/15 0746) Resp:  [18] 18 (01/14 1927) BP: (120-138)/(58-66) 126/61 (01/15 0746) SpO2:  [90 %-96 %] 93 % (01/15 0746) Weight:  [84.5 kg (186 lb 3.2 oz)] 84.5 kg (186 lb 3.2 oz) (01/15 0347)  Weight change:  Filed Weights   02/01/17 1121 02/03/17 0900 02/04/17 0347  Weight: 72.6 kg (160 lb) 72.2 kg (159 lb 2.8 oz) 84.5 kg (186 lb 3.2 oz)    Intake/Output:    Intake/Output Summary (Last 24 hours) at 02/04/2017 1134 Last data filed at 02/04/2017 1020 Gross per 24 hour  Intake 1080 ml  Output 600 ml  Net 480 ml     Physical Exam: General: Sitting up in a chair. Appears fatigued   HEENT Nomocephalic, atraumatic.   Neck Supple. No venous distention   Pulm/lungs Clear to auscultation   CVS/Heart Systolic heart murmur. Irregular  rhythm   Abdomen:  Soft.   Extremities: TED hose on lower extremities. 2+ Edema   Neurologic: Alert and Oriented   Skin:  Slight jaundice           Basic Metabolic Panel:  Recent Labs  Lab 02/01/17 1248 02/01/17 1805 02/02/17 0026 02/03/17 0419 02/04/17 0523  NA 121*  --  124* 122* 120*  K 2.4*  --  3.3* 4.4 4.6  CL 84*  --  89* 90* 89*  CO2 26  --  24 21* 19*  GLUCOSE 182*  --  149* 138* 118*  BUN 18  --  17 23* 28*  CREATININE 1.71* 1.56* 1.58* 2.11* 2.24*  CALCIUM 7.1*  --  6.9* 7.1* 7.0*  MG  --  2.0  --  1.8  --      CBC: Recent Labs  Lab 02/01/17 1108 02/02/17 0026 02/03/17 0419  WBC 11.2* 11.1* 10.5  NEUTROABS 9.8*  --  9.1*  HGB 14.0 13.4 11.7*  HCT 42.5 40.2 34.8*  MCV 89.6 88.6 89.4  PLT 114* 102* 94*       Lab Results  Component Value Date   HEPBSAG Negative 02/03/2017   HEPBIGM Negative 02/03/2017      Microbiology:  Recent Results (from the past 240 hour(s))  Culture, blood (routine x 2)     Status: None (Preliminary result)   Collection Time: 02/01/17 11:08 AM  Result Value Ref Range Status   Specimen Description BLOOD NECK LEFT  Final   Special Requests   Final    BOTTLES DRAWN AEROBIC AND ANAEROBIC Blood Culture adequate volume   Culture   Final    NO GROWTH 3 DAYS Performed at [facility], [address], [place], [place] [address]    Report Status PENDING  Incomplete  Urine culture     Status: Abnormal   Collection Time: 02/01/17 11:29 AM  Result Value Ref Range Status   Specimen Description   Final    URINE, RANDOM Performed at [facility], [address], [place], [place] [address]    Special Requests   Final    NONE Performed at [facility], [address] [place], [place]  [address]    Culture (A)  Final    <10,000 COLONIES/mL INSIGNIFICANT GROWTH Performed at [facility], [place] [address] [place], [place] [address]    Report Status 02/03/2017 FINAL  Final  Culture, blood (routine x 2)     Status: None (Preliminary result)   Collection Time: 02/01/17  5:42 PM  Result Value Ref Range Status   Specimen Description BLOOD RIGHT ANTECUBITAL  Final   Special Requests   Final    BOTTLES DRAWN AEROBIC AND ANAEROBIC Blood Culture results may not be optimal due to an excessive volume of blood received in culture bottles   Culture   Final    NO GROWTH 3 DAYS Performed at [facility], [address] [place], [place] [address]    Report Status PENDING  Incomplete    Coagulation Studies: Recent Labs    02/01/17 1805  LABPROT 38.8*  INR 4.05*    Urinalysis: Recent Labs    02/04/17 0917  COLORURINE AMBER*  LABSPEC 1.018  PHURINE 5.0  GLUCOSEU NEGATIVE  HGBUR MODERATE*  BILIRUBINUR NEGATIVE   KETONESUR NEGATIVE  PROTEINUR 100*  NITRITE NEGATIVE  LEUKOCYTESUR LARGE*      Imaging: Ct Chest Wo Contrast  Result Date: 02/02/2017 CLINICAL DATA:  Acute sepsis. Rales. Large right paratracheal mass on recent portable chest. EXAM: CT CHEST WITHOUT CONTRAST TECHNIQUE: Multidetector CT imaging of the chest was performed following the standard protocol without IV contrast. COMPARISON:  Portable chest dated 02/01/2017 and chest CTA dated 05/28/2014. FINDINGS: Cardiovascular: Atheromatous calcifications, including the thoracic aorta and extensive dense coronary artery calcifications. Enlarged heart. Small pericardial effusion with a maximum thickness of 7 mm. Mediastinum/Nodes: Again demonstrated is marked, heterogeneous enlargement of the right lobe of the thyroid gland extending into the superior mediastinum on the right with progressive associated coarse calcifications. Again demonstrated is a smaller, similar-appearing heterogeneous enlargement of the thyroid gland on the left, extending into the substernal region. These are causing mild compression of the trachea with mild progression. On the right, this measures 9.0 cm in maximum diameter on image number 31 of series 2, previously measuring 9.4 cm in corresponding diameter. There has been a mild increase in size of a mildly prominent lymph node inferior to the enlarged right lobe of the thyroid gland. This is at the level of the carina and measures 11 mm in short axis diameter on image number 57 of series 2, previously 10 mm in corresponding diameter with less density previously. A left precarinal node has a short axis diameter of 10 mm on image number 62 of series 2, previously 11 mm and less dense. Lungs/Pleura: Interval moderate-sized left pleural effusion with mild adjacent left lower lobe atelectasis. There is also an interval small to moderate amount of loculated pleural fluid anteriorly on the right with small medial and lateral compartments in  the superior aspect of the right hemithorax. Upper Abdomen: Interval large, laminated gallstone in the inferior gallbladder, measuring 2.8 cm in maximum diameter on image number 162 of series 2, not included in its entirety. Previously demonstrated liver cysts are stable. Interval small to moderate amount of free peritoneal fluid in the upper abdomen. Dense atheromatous arterial calcifications in the upper abdomen. Musculoskeletal: Thoracic spine degenerative changes. Postmastectomy changes on the left. Diffuse subcutaneous edema. IMPRESSION: 1. Very large substernal goiter, remaining larger on the right than on the left. This corresponds to the radiographic mass. 2. Mild superior mediastinal adenopathy with little change, most likely reactive. 3. Interval moderate-sized left  pleural effusion and adjacent mild left lower lobe atelectasis. 4. Interval small to moderate amount of loculated pleural fluid superiorly on the right. 5. Minimal pericardial effusion. 6. Densely calcified coronary artery and aortic atherosclerosis. 7. 2.8 cm large, laminated gallstone in the gallbladder. 8. Interval small to moderate amount of free peritoneal fluid in the upper abdomen. 9. Diffuse anasarca. Aortic Atherosclerosis (ICD10-I70.0). Electronically Signed   By: Claudie Revering M.D.   On: 02/02/2017 15:05   US Renal  Result Date: 02/03/2017 CLINICAL DATA:  Renal insufficiency. EXAM: RENAL / URINARY TRACT ULTRASOUND COMPLETE COMPARISON:  None. FINDINGS: Right Kidney: Length: 10.1 cm. Echogenicity within normal limits. No mass or hydronephrosis visualized. Left Kidney: Length: 11.0 cm. Echogenicity within normal limits. No mass or hydronephrosis visualized. Bladder: Appears normal for degree of bladder distention. IMPRESSION: Suboptimal examination due to occasionally poor acoustic windows and patient condition. No acute findings. Electronically Signed   By: Lorin Picket M.D.   On: 02/03/2017 15:41   Dg Abd 2 Views  Result  Date: 02/02/2017 CLINICAL DATA:  Persistent nausea. No bowel movement for the past 5 days. EXAM: ABDOMEN - 2 VIEW COMPARISON:  Portable chest dated 02/01/2017. Chest CT obtained today. FINDINGS: Dense opacity at the left lung base compatible with a combination of atelectasis and pleural fluid seen on the recent CT. Small amount of pleural fluid on the right. Enlarged cardiac silhouette. Previously demonstrated laminated gallstone in the gallbladder. Normal bowel gas pattern without free peritoneal air. Lumbar and lower thoracic spine degenerative changes. Diffuse osteopenia. IMPRESSION: 1. No acute abdominal abnormality. 2. Cardiomegaly, bilateral pleural fluid and left lower lobe atelectasis. 3. Cholelithiasis. Electronically Signed   By: Claudie Revering M.D.   On: 02/02/2017 15:07     Medications:   . sodium chloride (hypertonic) 50 mL/hr (02/04/17 1117)   . apixaban  2.5 mg Oral BID  . lactulose  30 g Oral BID  . sodium chloride flush  3 mL Intravenous Q12H  . sodium chloride  2 g Oral TID WC   acetaminophen **OR** acetaminophen, albuterol, bisacodyl, HYDROcodone-acetaminophen, ondansetron **OR** ondansetron (ZOFRAN) IV, promethazine, senna-docusate  Assessment/ Plan:  83 y.o. caucasian  female with a. Fib, mild dementia, history of left breast cancer status post mastectomy treated with chemo and radiation, severe mitral valve regurgitation, moderate tricuspid regurgitation, severe pulmonary hypertension by 2D echo October 2016, was admitted on 02/01/2017 with hypotension, diarrhea, nausea and vomiting was found to have acute renal failure and hyponatremia.   1. Acute Renal Failure ( baseline creatinine 0.49 in April 2018).   ARF likely from intravascular volume changes Avoid hypotension   2. Hyponatremia Chronic with acute worsening.  Continues to decrease despite sodium chloride treatment.  Today she was started on hypertonic saline   Urine electrolytes are pending.  Continue to monitor Na  frequently Goal of correction < 8 meq in 24 hrs  3.  Urinary Tract Infection.  Urine culture pending.   4. Anasarca due to hypoalbuminemia and third spacing of fluid.  Will consider albumin supplementation in the future.  Will continue to monitor.  GI evaluation is in progress     LOS: 3 Tomie Elko 1/15/201911:34 AM  [place], [place]

## 2017-02-04 NOTE — Progress Notes (Signed)
[facility] Encounter Note  Patient: Alexis Barry / Admit Date: 02/01/2017 / Date of Encounter: 02/04/2017, 5:18 AM   Subjective: Patient breathing and more comfortable today than admission. Heart rate slightly improved at 80 bpm at rest with the previous slow heart rate due to medication management. Cough and congestion improved. No evidence of congestive heart failure or myocardial infarction patient is very weak and will need physical therapy  Review of Systems: Positive for: Cough congestion Negative for: Vision change, hearing change, syncope, dizziness, nausea, vomiting,diarrhea, bloody stool, stomach pain, positive for cough, congestion, negative for diaphoresis, urinary frequency, urinary pain,skin lesions, skin rashes Others previously listed  Objective: Telemetry: Atrial fibrillation with controlled ventricular rate Physical Exam: Blood pressure (!) 120/58, pulse 97, temperature 98.4 F (36.9 C), temperature source Oral, resp. rate 18, height 5\' 7"  (1.702 m), weight 84.5 kg (186 lb 3.2 oz), SpO2 94 %. Body mass index is 29.16 kg/m. General: Well developed, well nourished, in no acute distress. Head: Normocephalic, atraumatic, sclera non-icteric, no xanthomas, nares are without discharge. Neck: No apparent masses Lungs: Normal respirations with some wheezes, few rhonchi, no rales , no crackles   Heart: Irregular rate and rhythm, normal S1 S2, no murmur, no rub, no gallop, PMI is normal size and placement, carotid upstroke normal without bruit, jugular venous pressure normal Abdomen: Soft, non-tender, non-distended with normoactive bowel sounds. No hepatosplenomegaly. Abdominal aorta is normal size without bruit Extremities: No edema, no clubbing, no cyanosis, no ulcers,  Peripheral: 2+ radial, 2+ femoral, 2+ dorsal pedal pulses Neuro: Alert and oriented. Moves all extremities spontaneously. Psych:  Responds to questions appropriately with a normal  affect.   Intake/Output Summary (Last 24 hours) at 02/04/2017 0518 Last data filed at 02/04/2017 0357 Gross per 24 hour  Intake 1662.5 ml  Output 200 ml  Net 1462.5 ml    Inpatient Medications:  . apixaban  2.5 mg Oral BID  . sodium chloride flush  3 mL Intravenous Q12H  . sodium chloride  2 g Oral TID WC   Infusions:    Labs: Recent Labs    02/01/17 1805 02/02/17 0026 02/03/17 0419  NA  --  124* 122*  K  --  3.3* 4.4  CL  --  89* 90*  CO2  --  24 21*  GLUCOSE  --  149* 138*  BUN  --  17 23*  CREATININE 1.56* 1.58* 2.11*  CALCIUM  --  6.9* 7.1*  MG 2.0  --  1.8   Recent Labs    02/01/17 1248 02/02/17 0026  AST 242* 251*  ALT 47 49  ALKPHOS 276* 291*  BILITOT 5.3* 5.5*  PROT 4.5* 4.3*  ALBUMIN 2.4* 2.3*   Recent Labs    02/01/17 1108 02/02/17 0026 02/03/17 0419  WBC 11.2* 11.1* 10.5  NEUTROABS 9.8*  --  9.1*  HGB 14.0 13.4 11.7*  HCT 42.5 40.2 34.8*  MCV 89.6 88.6 89.4  PLT 114* 102* 94*   Recent Labs    02/01/17 1108 02/01/17 1805 02/02/17 0026  TROPONINI 0.08* 0.06* 0.05*   Invalid input(s): POCBNP No results for input(s): HGBA1C in the last 72 hours.   Weights: Filed Weights   02/01/17 1121 02/03/17 0900 02/04/17 0347  Weight: 72.6 kg (160 lb) 72.2 kg (159 lb 2.8 oz) 84.5 kg (186 lb 3.2 oz)     Radiology/Studies:  Ct Chest Wo Contrast  Result Date: 02/02/2017 CLINICAL DATA:  Acute sepsis. Rales. Large right paratracheal mass on recent portable  chest. EXAM: CT CHEST WITHOUT CONTRAST TECHNIQUE: Multidetector CT imaging of the chest was performed following the standard protocol without IV contrast. COMPARISON:  Portable chest dated 02/01/2017 and chest CTA dated 05/28/2014. FINDINGS: Cardiovascular: Atheromatous calcifications, including the thoracic aorta and extensive dense coronary artery calcifications. Enlarged heart. Small pericardial effusion with a maximum thickness of 7 mm. Mediastinum/Nodes: Again demonstrated is marked,  heterogeneous enlargement of the right lobe of the thyroid gland extending into the superior mediastinum on the right with progressive associated coarse calcifications. Again demonstrated is a smaller, similar-appearing heterogeneous enlargement of the thyroid gland on the left, extending into the substernal region. These are causing mild compression of the trachea with mild progression. On the right, this measures 9.0 cm in maximum diameter on image number 31 of series 2, previously measuring 9.4 cm in corresponding diameter. There has been a mild increase in size of a mildly prominent lymph node inferior to the enlarged right lobe of the thyroid gland. This is at the level of the carina and measures 11 mm in short axis diameter on image number 57 of series 2, previously 10 mm in corresponding diameter with less density previously. A left precarinal node has a short axis diameter of 10 mm on image number 62 of series 2, previously 11 mm and less dense. Lungs/Pleura: Interval moderate-sized left pleural effusion with mild adjacent left lower lobe atelectasis. There is also an interval small to moderate amount of loculated pleural fluid anteriorly on the right with small medial and lateral compartments in the superior aspect of the right hemithorax. Upper Abdomen: Interval large, laminated gallstone in the inferior gallbladder, measuring 2.8 cm in maximum diameter on image number 162 of series 2, not included in its entirety. Previously demonstrated liver cysts are stable. Interval small to moderate amount of free peritoneal fluid in the upper abdomen. Dense atheromatous arterial calcifications in the upper abdomen. Musculoskeletal: Thoracic spine degenerative changes. Postmastectomy changes on the left. Diffuse subcutaneous edema. IMPRESSION: 1. Very large substernal goiter, remaining larger on the right than on the left. This corresponds to the radiographic mass. 2. Mild superior mediastinal adenopathy with little  change, most likely reactive. 3. Interval moderate-sized left pleural effusion and adjacent mild left lower lobe atelectasis. 4. Interval small to moderate amount of loculated pleural fluid superiorly on the right. 5. Minimal pericardial effusion. 6. Densely calcified coronary artery and aortic atherosclerosis. 7. 2.8 cm large, laminated gallstone in the gallbladder. 8. Interval small to moderate amount of free peritoneal fluid in the upper abdomen. 9. Diffuse anasarca. Aortic Atherosclerosis (ICD10-I70.0). Electronically Signed   By: Claudie Revering M.D.   On: 02/02/2017 15:05   US Renal  Result Date: 02/03/2017 CLINICAL DATA:  Renal insufficiency. EXAM: RENAL / URINARY TRACT ULTRASOUND COMPLETE COMPARISON:  None. FINDINGS: Right Kidney: Length: 10.1 cm. Echogenicity within normal limits. No mass or hydronephrosis visualized. Left Kidney: Length: 11.0 cm. Echogenicity within normal limits. No mass or hydronephrosis visualized. Bladder: Appears normal for degree of bladder distention. IMPRESSION: Suboptimal examination due to occasionally poor acoustic windows and patient condition. No acute findings. Electronically Signed   By: Lorin Picket M.D.   On: 02/03/2017 15:41   Dg Chest Port 1 View  Result Date: 02/01/2017 CLINICAL DATA:  Weakness, cough EXAM: PORTABLE CHEST 1 VIEW COMPARISON:  01/19/2015 FINDINGS: Large right paratracheal mass has increased since prior study. This was shown on prior CT to represent intrathoracic goiter. There is deviation of the trachea to the left. Cardiomegaly. Small bilateral pleural effusions. Bibasilar  atelectasis. IMPRESSION: Large right paratracheal mass, increased in size since prior study. This was shown on prior CT to represent intrathoracic/substernal goiter. Cardiomegaly. Small effusions with bibasilar atelectasis. Electronically Signed   By: Rolm Baptise M.D.   On: 02/01/2017 12:24   Dg Abd 2 Views  Result Date: 02/02/2017 CLINICAL DATA:  Persistent nausea. No  bowel movement for the past 5 days. EXAM: ABDOMEN - 2 VIEW COMPARISON:  Portable chest dated 02/01/2017. Chest CT obtained today. FINDINGS: Dense opacity at the left lung base compatible with a combination of atelectasis and pleural fluid seen on the recent CT. Small amount of pleural fluid on the right. Enlarged cardiac silhouette. Previously demonstrated laminated gallstone in the gallbladder. Normal bowel gas pattern without free peritoneal air. Lumbar and lower thoracic spine degenerative changes. Diffuse osteopenia. IMPRESSION: 1. No acute abdominal abnormality. 2. Cardiomegaly, bilateral pleural fluid and left lower lobe atelectasis. 3. Cholelithiasis. Electronically Signed   By: Claudie Revering M.D.   On: 02/02/2017 15:07   US Abdomen Limited Ruq  Addendum Date: 02/03/2017   ADDENDUM REPORT: 02/03/2017 09:32 ADDENDUM: Correction in the initial report. Blood flow in the portal vein is away from the liver, hepatofugal. Electronically Signed   By: Rolm Baptise M.D.   On: 02/03/2017 09:32   Result Date: 02/03/2017 CLINICAL DATA:  Elevated LFTs EXAM: ULTRASOUND ABDOMEN LIMITED RIGHT UPPER QUADRANT COMPARISON:  None. FINDINGS: Gallbladder: 2.6 cm gallstone. No wall thickening or sonographic Murphy's. The gallstone is mobile. Common bile duct: Diameter: Normal diameter, 5 mm Liver: Multiple cysts, the largest 3.6 cm in the right lobe. Small solid-appearing nodules in the right hepatic lobe, measuring 1.3 cm and 9 mm maximally. Diffusely heterogeneous echotexture. Nodular contours of the liver suggest early cirrhosis. Portal vein is patent on color Doppler imaging with normal direction of blood flow towards the liver. IMPRESSION: Abnormal appearance of the liver with markedly heterogeneous echotexture. Nodular contours of the surface suggests cirrhosis. Scattered cystic lesions throughout the liver. Two small possible solid nodules in the liver, the largest 1.3 cm in the right lobe. Cholelithiasis.  No sonographic  evidence of acute cholecystitis. Electronically Signed: By: Rolm Baptise M.D. On: 02/01/2017 16:07     Assessment and Recommendation  83 y.o. female with known chronic nonvalvular atrial fibrillation essential hypertension with acute significant infection and new onset significant bradycardia multifactorial in nature as well as elevated troponin more consistent with demand ischemia rather than acute coronary syndrome 1. Continue to abstain from diltiazem and metoprolol due to heart rate control without these medications 2. Begin ambulation and follow for need to have reinstatement of beta blocker as necessary for goal heart rate between 60 and 90 bpm 3. Continue anticoagulation with the elequis without change today for further risk reduction in stroke with atrial fibrillation 4. Continue supportive care of infection 5. No further cardiovascular intervention and/or diagnostics necessary at this time 6.  Okay for discharge home from cardiac standpoint with follow-up in 1 week  Signed, Serafina Royals M.D. FACC

## 2017-02-04 NOTE — Evaluation (Signed)
Physical Therapy Evaluation Patient Details Name: Alexis Barry MRN: [id number] DOB: May 18, 1934 Today's Date: 02/04/2017   History of Present Illness  Pt is an 83 y.o. female presenting to hospital 02/01/17 with fatigue, N/V, cough, congestion, and inability to ambulate.  Pt admitted with sepsis, possible UTI, hypotension, lactic acidosis, hypokalemia, hyponatremia, ARF, abnormal LFT, bradycardia.  PMH includes a-fib, htn, breast CA s/p L mastectomy, CHF, goiter, htn.  Imaging showing large R paratracheal mass.  Elevated troponin consistent with demand ischemia.  Clinical Impression  Prior to hospital admission, pt was independent with functional mobility.  Pt lives alone in 1 level home with 5-6 steps to enter with railings.  Currently pt is SBA supine to sit and CGA to min assist with transfers and ambulating 15 feet with RW.  Distance ambulated limited d/t SOB and fatigue (HR elevated up to 120 bpm with activity).  Overall pt demonstrating decreased activity tolerance and generalized weakness.  Pt would benefit from skilled PT to address noted impairments and functional limitations (see below for any additional details).  Upon hospital discharge, recommend pt discharge to STR for strengthening, balance, activity tolerance, and increasing independence with functional mobility.    Follow Up Recommendations SNF    Equipment Recommendations  Rolling walker with 5" wheels    Recommendations for Other Services OT consult     Precautions / Restrictions Precautions Precautions: Fall Restrictions Weight Bearing Restrictions: No      Mobility  Bed Mobility Overal bed mobility: Needs Assistance Bed Mobility: Supine to Sit     Supine to sit: Supervision;HOB elevated     General bed mobility comments: mild increased effort to perform on own  Transfers Overall transfer level: Needs assistance Equipment used: Rolling walker (2 wheeled) Transfers: Sit to/from [employer] Sit to Stand: Min guard;Min assist Stand pivot transfers: Min guard;Min assist       General transfer comment: increased effort to stand up to RW; requires assist for sitting (pt tends to sit down quickly)  Ambulation/Gait Ambulation/Gait assistance: Min guard;Min assist Ambulation Distance (Feet): 15 Feet Assistive device: Rolling walker (2 wheeled)   Gait velocity: decreased   General Gait Details: decreased B step length/foot clearance/heelstrike; occasional assist with walker with turning  Stairs            Wheelchair Mobility    Modified Rankin (Stroke Patients Only)       Balance Overall balance assessment: Needs assistance Sitting-balance support: No upper extremity supported;Feet supported Sitting balance-Leahy Scale: Good Sitting balance - Comments: steady sitting reaching within BOS   Standing balance support: Bilateral upper extremity supported Standing balance-Leahy Scale: Poor Standing balance comment: requires B UE support for static standing balance                             Pertinent Vitals/Pain Pain Assessment: No/denies pain  HR 102-120 bpm during session. Unable to obtain O2 saturation during session (fingers cold; attempted to warm but still unable to get reading--pt also with finger nail polish on).    Home Living Family/patient expects to be discharged to:: Private residence Living Arrangements: Alone   Type of Home: House Home Access: Stairs to enter Entrance Stairs-Rails: Psychiatric nurse of Steps: 5-6 steps (can not reach both railings) Home Layout: One level Home Equipment: [place] - 2 wheels;Shower seat      Prior Function Level of Independence: Independent         Comments: Pt reports  no falls in past 6 months.  Does not drive.  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  Recently having assist on stairs d/t weakness from not feeling well.     Hand Dominance         Extremity/Trunk Assessment   Upper Extremity Assessment Upper Extremity Assessment: Generalized weakness    Lower Extremity Assessment Lower Extremity Assessment: Generalized weakness    Cervical / Trunk Assessment Cervical / Trunk Assessment: Normal  Communication   Communication: No difficulties  Cognition Arousal/Alertness: Awake/alert Behavior During Therapy: WFL for tasks assessed/performed Overall Cognitive Status: (Oriented to person, place, year and month, and situation)                                 General Comments: Some confusion noted during session occasionally.      General Comments General comments (skin integrity, edema, etc.): Bruising noted L lateral/slightly posterior trunk/thigh.  Increased swelling noted R elbow/proximal forearm (pt's daughter reports from infiltrated IV).  Bruising noted R UE.  Nursing cleared pt for participation in physical therapy.  Pt agreeable to PT session.      Exercises     Assessment/Plan    PT Assessment Patient needs continued PT services  PT Problem List Decreased strength;Decreased activity tolerance;Decreased balance;Decreased mobility;Decreased knowledge of use of DME;Decreased knowledge of precautions       PT Treatment Interventions DME instruction;Gait training;Stair training;Functional mobility training;Therapeutic activities;Therapeutic exercise;Balance training;Patient/family education    PT Goals (Current goals can be found in the Care Plan section)  Acute Rehab PT Goals Patient Stated Goal: to improve strength and independence PT Goal Formulation: With patient Time For Goal Achievement: 02/18/17 Potential to Achieve Goals: Good    Frequency Min 2X/week   Barriers to discharge Decreased caregiver support      Co-evaluation               AM-PAC PT "6 Clicks" Daily Activity  Outcome Measure Difficulty turning over in bed (including adjusting bedclothes, sheets and blankets)?:  A Little Difficulty moving from lying on back to sitting on the side of the bed? : A Little Difficulty sitting down on and standing up from a chair with arms (e.g., wheelchair, bedside commode, etc,.)?: Unable Help needed moving to and from a bed to chair (including a wheelchair)?: A Little Help needed walking in hospital room?: A Little Help needed climbing 3-5 steps with a railing? : A Lot 6 Click Score: 15    End of Session Equipment Utilized During Treatment: Gait belt Activity Tolerance: Patient limited by fatigue Patient left: in chair;with call bell/phone within reach;with chair alarm set;with family/visitor present Nurse Communication: Mobility status;Precautions PT Visit Diagnosis: Other abnormalities of gait and mobility (R26.89);Muscle weakness (generalized) (M62.81)    Time: 7654-6503 PT Time Calculation (min) (ACUTE ONLY): 28 min   Charges:   PT Evaluation $PT Eval Low Complexity: 1 Low PT Treatments $Therapeutic Activity: 8-22 mins   PT G CodesLeitha Bleak, PT 02/04/17, 10:47 AM [phone]

## 2017-02-04 NOTE — Consult Note (Signed)
[name], MD [address] [place], [place], [place], [address] [id number] [place], [place], [place], [address] Phone: [phone]  Fax: [phone]  Consultation  Referring Provider:     Dr. Jerelyn Charles Primary Care Physician:  Lavera Guise, MD Primary Gastroenterologist:  Virgel Manifold, MD        Reason for Consultation:     Liver cirrhosis  Date of Admission:  02/01/2017 Date of Consultation:  02/04/2017         HPI:   Alexis Barry is a 83 y.o. female admitted with 1-2-week history of loss of appetite, lethargy, hypotension and admitted for possible sepsis.  GI consulted for liver cirrhosis on ultrasound.  Daughter at bedside.  Patient reports a vague abdominal discomfort, denies abdominal pain.  Reports nausea but denies emesis.  Reports spitting up of mucus and denies any emesis per se.  Denies any previous history of cirrhosis or any liver dysfunction.  Denies any current or previous history of heavy alcohol use.  Denies over-the-counter medications or hepatotoxic drugs.  Liver enzymes were found to be elevated since admission with bilirubin of 5.3 and direct bilirubin of 2.8.  Ultrasound shows abnormal appearance of the liver with markedly heterogeneous echotexture.  Nodular contours suggesting cirrhosis.  Scattered cystic lesions throughout the liver.  2 small possible solid nodules in the liver largest 1.37 in the right lobe.  Cholelithiasis.  No evidence of acute cholecystitis.  CBD normal diameter at 5 mm.  Alk phos is also elevated to 340.  AST is 322.  ALT is normal at 51.  Previous history includes A. fib and patient is on a post explant at home.  INR is significantly elevated to 4, last checked on the 12th.  Also has previous history of CHF.  Past Medical History:  Diagnosis Date  . Atrial fibrillation ([place])   . Breast cancer ([place]) 08/21/12   left mastectomy  . Cancer [facility])    Left breast cancer s/p left mastectomy, chemo and radiation  . CHF  (congestive heart failure) ([place])   . Goiter   . Hypertension   . Mild dementia   . Personal history of chemotherapy   . Personal history of radiation therapy     Past Surgical History:  Procedure Laterality Date  . BREAST SURGERY     Left mastectomy  . MASTECTOMY Left 08/21/12   had chemo and rad  . THYROID SURGERY  1977    Prior to Admission medications   Medication Sig Start Date End Date Taking? Authorizing Provider  apixaban (ELIQUIS) 5 MG TABS tablet Take 5 mg by mouth 2 (two) times daily.   Yes [provider]  diltiazem (CARDIZEM CD) 180 MG 24 hr capsule Take 180 mg by mouth daily.   Yes [provider]  furosemide (LASIX) 20 MG tablet Take 40 mg by mouth every morning.   Yes [provider]  letrozole (FEMARA) 2.5 MG tablet TAKE 1 TABLET(2.5 MG) BY MOUTH DAILY 01/20/17  Yes Lloyd Huger, MD  metoprolol (LOPRESSOR) 50 MG tablet Take 50 mg by mouth 2 (two) times daily.   Yes [provider]  rosuvastatin (CRESTOR) 10 MG tablet Take 10 mg by mouth daily.   Yes [provider]  acetaminophen (TYLENOL) 500 MG tablet Take 500 mg by mouth every 6 (six) hours as needed.    [provider]  alendronate (FOSAMAX) 70 MG tablet TAKE 1 TABLET(70 MG) BY MOUTH 1 TIME A WEEK WITH A  FULL GLASS OF WATER AND ON AN EMPTY STOMACH Patient not taking: Reported on 02/01/2017 12/10/16   Lloyd Huger, MD    Family History  Problem Relation Age of Onset  . Breast cancer Maternal Aunt   . CAD Father      Social History   Tobacco Use  . Smoking status: Never Smoker  . Smokeless tobacco: Never Used  Substance Use Topics  . Alcohol use: No  . Drug use: No    Allergies as of 02/01/2017  . (No Known Allergies)    Review of Systems:    All systems reviewed and negative except where noted in HPI.   Physical Exam:  Vital signs in last 24 hours: Vitals:   02/03/17 1603 02/03/17 1927 02/04/17 0347 02/04/17 0746  BP: 138/66  (!) 128/59 (!) 120/58 126/61  Pulse: 89 (!) 108 97 (!) 102  Resp:  18    Temp: 98.1 F (36.7 C) 98.6 F (37 C) 98.4 F (36.9 C) 98.8 F (37.1 C)  TempSrc: Oral Oral Oral Oral  SpO2: 90% 96% 94% 93%  Weight:   186 lb 3.2 oz (84.5 kg)   Height:       Last BM Date: 02/02/17 General:   Pleasant, cooperative in NAD Head:  Normocephalic and atraumatic. Eyes:   No icterus.   Conjunctiva pink. PERRLA. Ears:  Normal auditory acuity. Neck:  Supple; no masses or thyroidomegaly Lungs: Respirations even and unlabored. Lungs clear to auscultation bilaterally.   No wheezes, crackles, or rhonchi.  Heart:  Regular rate and rhythm;  Without murmur, clicks, rubs or gallops, 1+ edema bilaterally Abdomen:  Soft, nondistended, nontender. Normal bowel sounds. No appreciable masses or hepatomegaly.  No rebound or guarding.  Neurologic:  Alert and oriented x3;  grossly normal neurologically. Skin:  Intact without significant lesions or rashes. Cervical Nodes:  No significant cervical adenopathy. Psych:  Alert and cooperative. Normal affect.  LAB RESULTS: Recent Labs    02/01/17 1108 02/02/17 0026 02/03/17 0419  WBC 11.2* 11.1* 10.5  HGB 14.0 13.4 11.7*  HCT 42.5 40.2 34.8*  PLT 114* 102* 94*   BMET Recent Labs    02/02/17 0026 02/03/17 0419 02/04/17 0523  NA 124* 122* 120*  K 3.3* 4.4 4.6  CL 89* 90* 89*  CO2 24 21* 19*  GLUCOSE 149* 138* 118*  BUN 17 23* 28*  CREATININE 1.58* 2.11* 2.24*  CALCIUM 6.9* 7.1* 7.0*   LFT Recent Labs    02/04/17 0523  PROT 4.0*  ALBUMIN 2.1*  AST 322*  ALT 51  ALKPHOS 340*  BILITOT 6.4*  BILIDIR 3.6*  IBILI 2.8*   PT/INR Recent Labs    02/01/17 1805  LABPROT 38.8*  INR 4.05*    STUDIES: Ct Chest Wo Contrast  Result Date: 02/02/2017 CLINICAL DATA:  Acute sepsis. Rales. Large right paratracheal mass on recent portable chest. EXAM: CT CHEST WITHOUT CONTRAST TECHNIQUE: Multidetector CT imaging of the chest was performed following the  standard protocol without IV contrast. COMPARISON:  Portable chest dated 02/01/2017 and chest CTA dated 05/28/2014. FINDINGS: Cardiovascular: Atheromatous calcifications, including the thoracic aorta and extensive dense coronary artery calcifications. Enlarged heart. Small pericardial effusion with a maximum thickness of 7 mm. Mediastinum/Nodes: Again demonstrated is marked, heterogeneous enlargement of the right lobe of the thyroid gland extending into the superior mediastinum on the right with progressive associated coarse calcifications. Again demonstrated is a smaller, similar-appearing heterogeneous enlargement of the thyroid gland on the left, extending into the substernal region.  These are causing mild compression of the trachea with mild progression. On the right, this measures 9.0 cm in maximum diameter on image number 31 of series 2, previously measuring 9.4 cm in corresponding diameter. There has been a mild increase in size of a mildly prominent lymph node inferior to the enlarged right lobe of the thyroid gland. This is at the level of the carina and measures 11 mm in short axis diameter on image number 57 of series 2, previously 10 mm in corresponding diameter with less density previously. A left precarinal node has a short axis diameter of 10 mm on image number 62 of series 2, previously 11 mm and less dense. Lungs/Pleura: Interval moderate-sized left pleural effusion with mild adjacent left lower lobe atelectasis. There is also an interval small to moderate amount of loculated pleural fluid anteriorly on the right with small medial and lateral compartments in the superior aspect of the right hemithorax. Upper Abdomen: Interval large, laminated gallstone in the inferior gallbladder, measuring 2.8 cm in maximum diameter on image number 162 of series 2, not included in its entirety. Previously demonstrated liver cysts are stable. Interval small to moderate amount of free peritoneal fluid in the upper  abdomen. Dense atheromatous arterial calcifications in the upper abdomen. Musculoskeletal: Thoracic spine degenerative changes. Postmastectomy changes on the left. Diffuse subcutaneous edema. IMPRESSION: 1. Very large substernal goiter, remaining larger on the right than on the left. This corresponds to the radiographic mass. 2. Mild superior mediastinal adenopathy with little change, most likely reactive. 3. Interval moderate-sized left pleural effusion and adjacent mild left lower lobe atelectasis. 4. Interval small to moderate amount of loculated pleural fluid superiorly on the right. 5. Minimal pericardial effusion. 6. Densely calcified coronary artery and aortic atherosclerosis. 7. 2.8 cm large, laminated gallstone in the gallbladder. 8. Interval small to moderate amount of free peritoneal fluid in the upper abdomen. 9. Diffuse anasarca. Aortic Atherosclerosis (ICD10-I70.0). Electronically Signed   By: Claudie Revering M.D.   On: 02/02/2017 15:05   US Renal  Result Date: 02/03/2017 CLINICAL DATA:  Renal insufficiency. EXAM: RENAL / URINARY TRACT ULTRASOUND COMPLETE COMPARISON:  None. FINDINGS: Right Kidney: Length: 10.1 cm. Echogenicity within normal limits. No mass or hydronephrosis visualized. Left Kidney: Length: 11.0 cm. Echogenicity within normal limits. No mass or hydronephrosis visualized. Bladder: Appears normal for degree of bladder distention. IMPRESSION: Suboptimal examination due to occasionally poor acoustic windows and patient condition. No acute findings. Electronically Signed   By: Lorin Picket M.D.   On: 02/03/2017 15:41   Dg Abd 2 Views  Result Date: 02/02/2017 CLINICAL DATA:  Persistent nausea. No bowel movement for the past 5 days. EXAM: ABDOMEN - 2 VIEW COMPARISON:  Portable chest dated 02/01/2017. Chest CT obtained today. FINDINGS: Dense opacity at the left lung base compatible with a combination of atelectasis and pleural fluid seen on the recent CT. Small amount of pleural fluid  on the right. Enlarged cardiac silhouette. Previously demonstrated laminated gallstone in the gallbladder. Normal bowel gas pattern without free peritoneal air. Lumbar and lower thoracic spine degenerative changes. Diffuse osteopenia. IMPRESSION: 1. No acute abdominal abnormality. 2. Cardiomegaly, bilateral pleural fluid and left lower lobe atelectasis. 3. Cholelithiasis. Electronically Signed   By: Claudie Revering M.D.   On: 02/02/2017 15:07      Impression / Plan:   Alexis Barry is a 83 y.o. y/o female with loss of appetite for 1-2 weeks, hypertension, with GI consulted for liver cirrhosis noted on ultrasound  Patient has  significantly elevated liver enzymes, including elevated liver enzymes, AST This needs further evaluation to evaluate for biliary duct obstruction, choledocholithiasis, or any masses Would recommend obtaining MRCP at this time to evaluate bile ducts Ultrasound specifically noted portal vein is patent, with normal direction of blood flow toward the liver on Doppler imaging.  Would also recommend viral hepatitis and autoimmune hepatitis testing (ordered). Avoid hepatotoxic drugs.  Patient is on as needed hydrocodone with acetaminophen.  Would recommend discontinuing the acetaminophen and any other hepatotoxic drugs. Would recommend rechecking INR with next labs Would recommend evaluating for right heart failure/CHF as I can cause elevation in liver enzymes as well.  Consider echo  If biliary duct obstruction is present on MRCP, INR will need to be corrected before any endoscopic procedures In addition her troponin was noted to be elevated and patient is apixiban.  This will need to be held prior to any procedures and patient will need cardiac clearance If biliary obstruction is present and if endoscopy is considered high risk, IR intervention with PTC would be the next option Would recommend medical optimization and correcting electrolyte abnormalities at this time Continue  daily CMP   Thank you for involving me in the care of this patient.      LOS: 3 days   Virgel Manifold, MD  02/04/2017, 11:01 AM

## 2017-02-04 NOTE — Progress Notes (Signed)
MEDICATION RELATED CONSULT NOTE - INITIAL   Pharmacy Consult for Sodium level  monitoring in patient on 3% Hypertonic saline Indication: hyponatremia  No Known Allergies  Patient Measurements: Height: 5\' 7"  (170.2 cm) Weight: 186 lb 3.2 oz (84.5 kg) IBW/kg (Calculated) : 61.6 Adjusted Body Weight:    Vital Signs: Temp: 97.6 F (36.4 C) (01/15 1553) Temp Source: Oral (01/15 1553) BP: 123/70 (01/15 1553) Pulse Rate: 88 (01/15 1601) Intake/Output from previous day: 01/14 0701 - 01/15 0700 In: 720 [P.O.:720] Out: 600 [Urine:600] Intake/Output from this shift: Total I/O In: 785.8 [P.O.:600; I.V.:185.8] Out: -   Labs: Recent Labs    02/01/17 1805 02/02/17 0026 02/03/17 0419 02/04/17 0523  WBC  --  11.1* 10.5  --   HGB  --  13.4 11.7*  --   HCT  --  40.2 34.8*  --   PLT  --  102* 94*  --   APTT 48*  --   --   --   CREATININE 1.56* 1.58* 2.11* 2.24*  MG 2.0  --  1.8  --   ALBUMIN  --  2.3*  --  2.1*  PROT  --  4.3*  --  4.0*  AST  --  251*  --  322*  ALT  --  49  --  51  ALKPHOS  --  291*  --  340*  BILITOT  --  5.5*  --  6.4*  BILIDIR 2.8* 2.8*  --  3.6*  IBILI  --  2.7*  --  2.8*   Estimated Creatinine Clearance: 22.4 mL/min (A) (by C-G formula based on SCr of 2.24 mg/dL (H)).   Medical History: Past Medical History:  Diagnosis Date  . Atrial fibrillation ([place])   . Breast cancer ([place]) 08/21/12   left mastectomy  . Cancer [facility])    Left breast cancer s/p left mastectomy, chemo and radiation  . CHF (congestive heart failure) ([place])   . Goiter   . Hypertension   . Mild dementia   . Personal history of chemotherapy   . Personal history of radiation therapy     Assessment: 83 yo F with hyponatremia on 3% saline drip at 50 ml/hr  1/15 1202  Na= 121 1/15 1638  Na= 125   Goal of Therapy:  Sodium wNL  Plan:  if sodium rises > 4 mEq/L over 2 hours or > 6 mEq/L over 4 hours contact MD  Charle Mclaurin A 02/04/2017,5:33 PM

## 2017-02-04 NOTE — Progress Notes (Signed)
Alexis Barry at [facility] NAME: Alexis Barry    MR#:  [id number]  DATE OF BIRTH:  04/23/1934  SUBJECTIVE:  CHIEF COMPLAINT:   Chief Complaint  Patient presents with  . Nausea  . Emesis  . Altered Mental Status  . Hypotension  Patient without complaint, cardiology/nephrology/GI notes reviewed  REVIEW OF SYSTEMS:  CONSTITUTIONAL: No fever, fatigue or weakness.  EYES: No blurred or double vision.  EARS, NOSE, AND THROAT: No tinnitus or ear pain.  RESPIRATORY: No cough, shortness of breath, wheezing or hemoptysis.  CARDIOVASCULAR: No chest pain, orthopnea, edema.  GASTROINTESTINAL: No nausea, vomiting, diarrhea or abdominal pain.  GENITOURINARY: No dysuria, hematuria.  ENDOCRINE: No polyuria, nocturia,  HEMATOLOGY: No anemia, easy bruising or bleeding SKIN: No rash or lesion. MUSCULOSKELETAL: No joint pain or arthritis.   NEUROLOGIC: No tingling, numbness, weakness.  PSYCHIATRY: No anxiety or depression.   ROS  DRUG ALLERGIES:  No Known Allergies  VITALS:  Blood pressure 123/70, pulse 88, temperature 97.6 F (36.4 C), temperature source Oral, resp. rate 20, height 5\' 7"  (1.702 m), weight 84.5 kg (186 lb 3.2 oz), SpO2 92 %.  PHYSICAL EXAMINATION:  GENERAL:  83 y.o.-year-old patient lying in the bed with no acute distress.  EYES: Pupils equal, round, reactive to light and accommodation. No scleral icterus. Extraocular muscles intact.  HEENT: Head atraumatic, normocephalic. Oropharynx and nasopharynx clear.  NECK:  Supple, no jugular venous distention. No thyroid enlargement, no tenderness.  LUNGS: Normal breath sounds bilaterally, no wheezing, rales,rhonchi or crepitation. No use of accessory muscles of respiration.  CARDIOVASCULAR: S1, S2 normal. No murmurs, rubs, or gallops.  ABDOMEN: Soft, nontender, nondistended. Bowel sounds present. No organomegaly or mass.  EXTREMITIES: No pedal edema, cyanosis, or clubbing.  NEUROLOGIC: Cranial  nerves II through XII are intact. Muscle strength 5/5 in all extremities. Sensation intact. Gait not checked.  PSYCHIATRIC: The patient is alert and oriented x 3.  SKIN: No obvious rash, lesion, or ulcer.   Physical Exam LABORATORY PANEL:   CBC Recent Labs  Lab 02/03/17 0419  WBC 10.5  HGB 11.7*  HCT 34.8*  PLT 94*   ------------------------------------------------------------------------------------------------------------------  Chemistries  Recent Labs  Lab 02/03/17 0419 02/04/17 0523 02/04/17 1202  NA 122* 120* 121*  121*  K 4.4 4.6  --   CL 90* 89*  --   CO2 21* 19*  --   GLUCOSE 138* 118*  --   BUN 23* 28*  --   CREATININE 2.11* 2.24*  --   CALCIUM 7.1* 7.0*  --   MG 1.8  --   --   AST  --  322*  --   ALT  --  51  --   ALKPHOS  --  340*  --   BILITOT  --  6.4*  --    ------------------------------------------------------------------------------------------------------------------  Cardiac Enzymes Recent Labs  Lab 02/01/17 1805 02/02/17 0026  TROPONINI 0.06* 0.05*   ------------------------------------------------------------------------------------------------------------------  RADIOLOGY:  US Renal  Result Date: 02/03/2017 CLINICAL DATA:  Renal insufficiency. EXAM: RENAL / URINARY TRACT ULTRASOUND COMPLETE COMPARISON:  None. FINDINGS: Right Kidney: Length: 10.1 cm. Echogenicity within normal limits. No mass or hydronephrosis visualized. Left Kidney: Length: 11.0 cm. Echogenicity within normal limits. No mass or hydronephrosis visualized. Bladder: Appears normal for degree of bladder distention. IMPRESSION: Suboptimal examination due to occasionally poor acoustic windows and patient condition. No acute findings. Electronically Signed   By: Lorin Picket M.D.   On: 02/03/2017 15:41  ASSESSMENT AND PLAN:  1acute abnormal liver function test Most likely secondary to cirrhosis Liver ultrasound noted for cirrhosis/liver nodules, acute hepatitis panel  negative, gastroenterology input appreciated-for echocardiogram/MRCP, ?  May require an ERCP, anticoagulation on hold  2 acute tubular necrosis Acute worsening noted Nephrology input appreciated Continue to avoid nephrotoxic agents, strict I&O monitoring, daily weights, renal ultrasound was unimpressive  3 acute moderate to severe hyponatremia Etiology unknown Start 3% saline, discontinue salt tabs, BMP daily  4 acute possible UTI Urine cultures negative Antibiotics discontinued  5acute lactic acidosis  Exact etiology unknown  Suspect may be related to acute tubular necrosis and acute liver failure   6 acute on chronic paratracheal mass Secondary to partially resected large goiter Stable Conservative observation/management CT/chest x-ray noted  TSH normal  7acute elevated troponins  Continue apixaban  Cardiology input appreciated-no further intervention/workup necessary per cardiology  8History of chronic A. Fib Stable-Eliquis on hold for possible future need for ERCP  Cardizem/beta-blocker therapy held due to relative low normal blood pressures  9chronic benign essential hypertension Currently normotensive off antihypertensives  10acute generalized weakness Most likely secondary to chronic deconditioning, acute liver failure, acute tubular necrosis Physical therapy to see   11persistent nausea Acute abdominal series unremarkable  Continue antiemetics PRN   All the records are reviewed and case discussed with Care Management/Social Workerr. Management plans discussed with the patient, family and they are in agreement.  CODE STATUS: dnr  TOTAL TIME TAKING CARE OF THIS PATIENT: 45 minutes.     POSSIBLE D/C IN 3-5 DAYS, DEPENDING ON CLINICAL CONDITION.   Avel Peace Salary M.D on 02/04/2017   Between 7am to 6pm - Pager - [phone]  After 6pm go to [email or web address] - password [email or web address] [facility]  Office   [phone]  CC: Primary care physician; Lavera Guise, MD  Note: This dictation was prepared with Dragon dictation along with smaller phrase technology. Any transcriptional errors that result from this process are unintentional.

## 2017-02-05 ENCOUNTER — Inpatient Hospital Stay: Payer: Medicare Other

## 2017-02-05 DIAGNOSIS — K7689 Other specified diseases of liver: Secondary | ICD-10-CM

## 2017-02-05 DIAGNOSIS — K7200 Acute and subacute hepatic failure without coma: Principal | ICD-10-CM

## 2017-02-05 LAB — COMPREHENSIVE METABOLIC PANEL
ALBUMIN: 2.1 g/dL — AB (ref 3.5–5.0)
ALK PHOS: 392 U/L — AB (ref 38–126)
ALT: 52 U/L (ref 14–54)
AST: 318 U/L — AB (ref 15–41)
Anion gap: 13 (ref 5–15)
BUN: 31 mg/dL — AB (ref 6–20)
CO2: 17 mmol/L — AB (ref 22–32)
Calcium: 7.4 mg/dL — ABNORMAL LOW (ref 8.9–10.3)
Chloride: 95 mmol/L — ABNORMAL LOW (ref 101–111)
Creatinine, Ser: 2.25 mg/dL — ABNORMAL HIGH (ref 0.44–1.00)
GFR calc Af Amer: 23 mL/min — ABNORMAL LOW (ref >60)
GFR calc non Af Amer: 19 mL/min — ABNORMAL LOW (ref >60)
GLUCOSE: 123 mg/dL — AB (ref 65–99)
Potassium: 4.5 mmol/L (ref 3.5–5.1)
SODIUM: 125 mmol/L — AB (ref 135–145)
TOTAL PROTEIN: 4.1 g/dL — AB (ref 6.5–8.1)
Total Bilirubin: 7.7 mg/dL — ABNORMAL HIGH (ref 0.3–1.2)

## 2017-02-05 LAB — ANTI-MICROSOMAL ANTIBODY LIVER / KIDNEY: LKM1 AB: 2.3 U (ref 0.0–20.0)

## 2017-02-05 LAB — ECHOCARDIOGRAM COMPLETE
Height: 67 in
Weight: 2979.2 oz

## 2017-02-05 LAB — HEPATITIS C ANTIBODY (REFLEX)

## 2017-02-05 LAB — ANTI-SMOOTH MUSCLE ANTIBODY, IGG: F-ACTIN AB IGG: 31 U — AB (ref 0–19)

## 2017-02-05 LAB — HEPATITIS B SURFACE ANTIBODY, QUANTITATIVE: Hepatitis B-Post: <3.1 m[IU]/mL — ABNORMAL LOW (ref >9.9)

## 2017-02-05 LAB — SODIUM: Sodium: 127 mmol/L — ABNORMAL LOW (ref 135–145)

## 2017-02-05 LAB — ETHANOL

## 2017-02-05 LAB — HCV COMMENT:

## 2017-02-05 LAB — VOLATILES,BLD-ACETONE,ETHANOL,ISOPROP,METHANOL
Acetone, blood: NEGATIVE % (ref 0.000–0.010)
Ethanol, blood: NEGATIVE % (ref 0.000–0.010)
Isopropanol, blood: NEGATIVE % (ref 0.000–0.010)
Methanol, blood: NEGATIVE % (ref 0.000–0.010)

## 2017-02-05 LAB — MITOCHONDRIAL ANTIBODIES: MITOCHONDRIAL M2 AB, IGG: 4.6 U (ref 0.0–20.0)

## 2017-02-05 LAB — URINE CULTURE: Culture: NO GROWTH

## 2017-02-05 LAB — HEPATITIS A ANTIBODY, TOTAL: Hep A Total Ab: POSITIVE — AB

## 2017-02-05 LAB — HEPATITIS B SURFACE ANTIGEN: HEP B S AG: NEGATIVE

## 2017-02-05 LAB — HEPATITIS A ANTIBODY, IGM: Hep A IgM: NEGATIVE

## 2017-02-05 LAB — CERULOPLASMIN: Ceruloplasmin: 26.1 mg/dL (ref 19.0–39.0)

## 2017-02-05 LAB — HEPATITIS B CORE ANTIBODY, TOTAL: Hep B Core Total Ab: NEGATIVE

## 2017-02-05 LAB — PROTIME-INR
INR: 2.9
Prothrombin Time: 30.1 seconds — ABNORMAL HIGH (ref 11.4–15.2)

## 2017-02-05 LAB — BRAIN NATRIURETIC PEPTIDE: B NATRIURETIC PEPTIDE 5: 228 pg/mL — AB (ref 0.0–100.0)

## 2017-02-05 LAB — HEPATITIS B E ANTIGEN: Hep B E Ag: NEGATIVE

## 2017-02-05 MED ORDER — SODIUM CHLORIDE 1 G PO TABS
2.0000 g | ORAL_TABLET | Freq: Three times a day (TID) | ORAL | Status: DC
  Filled 2017-02-05 (×4): qty 2

## 2017-02-05 MED ORDER — METOPROLOL TARTRATE 25 MG PO TABS
12.5000 mg | ORAL_TABLET | Freq: Two times a day (BID) | ORAL | Status: DC
  Administered 2017-02-05: 12.5 mg via ORAL
  Filled 2017-02-05 (×2): qty 1

## 2017-02-05 MED ORDER — SODIUM CHLORIDE 0.9% FLUSH
10.0000 mL | INTRAVENOUS | Status: DC | PRN
  Administered 2017-02-05: 10 mL via INTRAVENOUS

## 2017-02-05 MED ORDER — FUROSEMIDE 10 MG/ML IJ SOLN: 60.0000 mg | Freq: Once | INTRAMUSCULAR | Status: DC

## 2017-02-05 MED ORDER — ALBUMIN HUMAN 25 % IV SOLN
25.0000 g | Freq: Two times a day (BID) | INTRAVENOUS | Status: DC
  Administered 2017-02-05 – 2017-02-06 (×3): 25 g via INTRAVENOUS
  Filled 2017-02-05 (×4): qty 100

## 2017-02-05 NOTE — Clinical Social Work Note (Signed)
Clinical Social Work Assessment  Patient Details  Name: Alexis Barry MRN: [id number] Date of Birth: November 10, 1934  Date of referral:  02/05/17               Reason for consult:  Facility Placement                Permission sought to share information with:  Family Supports, Customer service manager Permission granted to share information::  Yes, Verbal Permission Granted  Name::     Alexis Barry    [phone] or Alexis Barry Daughter [phone]  [phone]   Agency::  SNF admissions  Relationship::     Contact Information:     Housing/Transportation Living arrangements for the past 2 months:  Single Family Home Source of Information:  Patient Patient Interpreter Needed:  None Criminal Activity/Legal Involvement Pertinent to Current Situation/Hospitalization:  No - Comment as needed Significant Relationships:  Adult Children Lives with:  Self Do you feel safe going back to the place where you live?  No Need for family participation in patient care:  No (Coment)  Care giving concerns:  Patient feels she needs some short term rehab before she is able to return back home.   Social Worker assessment / plan:  Patient is an 83 year old female who is alert and oriented x4.  Patient lives alone and has a son and daughter who are involved in her care.  Patient states she has not been to rehab before, CSW explained to patient what the role is of CSW, and process for looking for placement.  Patient was explained how insurance will pay for stay, and what to expect when discharging from SNF.  CSW explained the differences between home health and going to SNF for rehab.  Patient gave CSW permission to begin bed search in [place].  Patient did not express any other questions or concerns.  Employment status:  Retired Nurse, adult PT Recommendations:  [place] / Referral to community resources:     Patient/Family's  Response to care:  Patient is agreeable to going to SNF for short term rehab.  Patient/Family's Understanding of and Emotional Response to Diagnosis, Current Treatment, and Prognosis:  Patient is hopeful she will not have to be in rehab very long.  Emotional Assessment Appearance:  Appears stated age Attitude/Demeanor/Rapport:    Affect (typically observed):  Appropriate, Calm Orientation:  Oriented to Situation, Oriented to Place, Oriented to Self, Oriented to  Time Alcohol / Substance use:  Not Applicable Psych involvement (Current and /or in the community):  No (Comment)  Discharge Needs  Concerns to be addressed:  Lack of Support, Care Coordination Readmission within the last 30 days:  No Current discharge risk:  Lack of support system, Lives alone Barriers to Discharge:  Ship broker, Continued Medical Work up   Alexis Barry 02/05/2017, 5:52 PM

## 2017-02-05 NOTE — Progress Notes (Addendum)
Oxygen saturations 70s on room air.  Improved to 90s on [place] [facility] [place].  Audible gurgling continues.  Dr Duane Boston made aware.  Orders given.  IV Lasix not given per previous MD order to avoid hypotension and nephrotoxins.  Pt repositioned and oxygen applied.  Less gurgling afterwards.

## 2017-02-05 NOTE — Clinical Social Work Note (Signed)
CSW met with patient and discussed PT recommendations for SNF.  CSW talked to patient about short term rehab and explained the differences between home health PT and SNF PT.  Patient requested that CSW speak to her daughter and son.  CSW contacted patient's daughter Alexis Barry [phone] and explained everything to her.  Patient's daughter requested that CSW speak to patient's son Alexis Barry [phone] who is HCPOA.  CSW left message on Danny's voice mail awaiting for call back.  CSW to continue to follow patient's progress.  Jones Broom. [place], MSW, [place]  02/05/2017 6:00 PM

## 2017-02-05 NOTE — Progress Notes (Addendum)
[name], MD [address], [place], [place], [place], [address] [address], [place], [place], [place], [address] Phone: [phone]  Fax: [phone]   Subjective: Patient laying in bed.  She appears lethargic.  Is able to answer questions.  Appears jaundiced.   Objective: Vital signs in last 24 hours: Vitals:   02/04/17 1601 02/04/17 1953 02/05/17 0445 02/05/17 0753  BP:  (!) 106/55 (!) 118/58 123/65  Pulse: 88 (!) 117 (!) 123 (!) 127  Resp:  17 17   Temp:  98 F (36.7 C) 98 F (36.7 C) (!) 97.5 F (36.4 C)  TempSrc:  Oral Oral Oral  SpO2:  92% 93% 92%  Weight:   189 lb 1.6 oz (85.8 kg)   Height:       Weight change: 29 lb 14.9 oz (13.6 kg)  Intake/Output Summary (Last 24 hours) at 02/05/2017 1307 Last data filed at 02/05/2017 0730 Gross per 24 hour  Intake 185.83 ml  Output 100 ml  Net 85.83 ml     Exam: Cardiac: +S1, +S2, RRR, 1+ edema bilaterally Pulm: CTA b/l, Normal Resp Effort Abd: Soft, NT/ND, No HSM Skin: Warm, no rashes, jaundice Neck: Supple, Trachea midline   Lab Results: Reviewed Micro Results: Recent Results (from the past 240 hour(s))  Culture, blood (routine x 2)     Status: None (Preliminary result)   Collection Time: 02/01/17 11:08 AM  Result Value Ref Range Status   Specimen Description BLOOD NECK LEFT  Final   Special Requests   Final    BOTTLES DRAWN AEROBIC AND ANAEROBIC Blood Culture adequate volume   Culture   Final    NO GROWTH 4 DAYS Performed at [facility], [address], [place], [place] [address]    Report Status PENDING  Incomplete  Urine culture     Status: Abnormal   Collection Time: 02/01/17 11:29 AM  Result Value Ref Range Status   Specimen Description   Final    URINE, RANDOM Performed at [facility], [address], [place], [place] [address]    Special Requests   Final    NONE Performed at [facility], [address] [place], [place] [address]    Culture  (A)  Final    <10,000 COLONIES/mL INSIGNIFICANT GROWTH Performed at [facility], [address], [place], [place] [address]    Report Status 02/03/2017 FINAL  Final  Culture, blood (routine x 2)     Status: None (Preliminary result)   Collection Time: 02/01/17  5:42 PM  Result Value Ref Range Status   Specimen Description BLOOD RIGHT ANTECUBITAL  Final   Special Requests   Final    BOTTLES DRAWN AEROBIC AND ANAEROBIC Blood Culture results may not be optimal due to an excessive volume of blood received in culture bottles   Culture   Final    NO GROWTH 4 DAYS Performed at [facility], [address], [place], [place] [address]    Report Status PENDING  Incomplete  Urine Culture     Status: None   Collection Time: 02/04/17  9:17 AM  Result Value Ref Range Status   Specimen Description   Final    URINE, CLEAN CATCH Performed at [facility], [address], [place], [place] [address]    Special Requests   Final    NONE Performed at [facility], [address] [place], [place] [address]    Culture   Final  NO GROWTH Performed at [facility], [place] [address], [place], [place] [address]    Report Status 02/05/2017 FINAL  Final   Studies/Results: US Renal  Result Date: 02/03/2017 CLINICAL DATA:  Renal insufficiency. EXAM: RENAL / URINARY TRACT ULTRASOUND COMPLETE COMPARISON:  None. FINDINGS: Right Kidney: Length: 10.1 cm. Echogenicity within normal limits. No mass or hydronephrosis visualized. Left Kidney: Length: 11.0 cm. Echogenicity within normal limits. No mass or hydronephrosis visualized. Bladder: Appears normal for degree of bladder distention. IMPRESSION: Suboptimal examination due to occasionally poor acoustic windows and patient condition. No acute findings. Electronically Signed   By: Lorin Picket M.D.   On: 02/03/2017 15:41   Mr Abdomen Mrcp Wo Contrast  Result Date: 02/05/2017 CLINICAL DATA:  Pt stopped exam due to  discomfort of the MRI couch and cough/vomiting, unable to get last sequence AX DWI. Pt has diff holding her breath as well. 83 y.o. female admitted with 1-2-week history of loss of appetite, lethargy,. Cirrhotic liver noted on ultrasound. Gallstones. Elevated liver function tests and bilirubin. EXAM: MRI ABDOMEN WITHOUT CONTRAST  (INCLUDING MRCP) TECHNIQUE: Multiplanar multisequence MR imaging of the abdomen was performed. Heavily T2-weighted images of the biliary and pancreatic ducts were obtained, and three-dimensional MRCP images were rendered by post processing. COMPARISON:  Ultrasound 02/03/2017 CT chest 02/02/2017 FINDINGS: Exam is significantly degraded by respiratory motion. No T1 imaging performed. No postcontrast imaging performed. Lower chest:  Moderate LEFT effusion Hepatobiliary: Liver has a fine nodular contour. There are multiple round lesions within LEFT and RIGHT hepatic lobe measuring 1-2 cm which are mildly hyperintense on T2 weighted imaging. Additional round lesions which are high signal intensity on T2 weighted imaging consistent with cysts. Gallbladder distended to 3.9 cm. There is a large gallstone within the proximal aspect of the gallbladder measuring 2.9 cm. Common bile duct is normal caliber. There is no clear intrahepatic biliary duct dilatation. Pancreas: Pancreatic duct is not dilated. No peripancreatic fluid collections. Spleen: Spleen is normal volume Adrenals/urinary tract: Adrenal glands and kidneys are normal. Stomach/Bowel: Stomach and limited of the small bowel is unremarkable Vascular/Lymphatic: Abdominal aortic normal caliber. No retroperitoneal periportal lymphadenopathy. Musculoskeletal: No aggressive osseous lesion IMPRESSION: 1. Exam is severely limited due to respiratory motion and patient discomfort. This is not uncommon in a sick elderly patient. 2. Multiple round lesions within the liver may represent regenerating nodules or dysplastic nodules. Cannot exclude hepatoma  on noncontrast exam. Liver does have a fine nodular contour suggesting cirrhosis. Small amount of ascites along the RIGHT hepatic lobe. 3. No biliary duct dilatation. Large gallstone within a mildly distended gallbladder. Cannot exclude mild cholecystitis. 4. No choledocholithiasis. 5. Normal pancreas on limited exam. 6. Small RIGHT effusion. Electronically Signed   By: Suzy Bouchard M.D.   On: 02/05/2017 08:19   Mr 3d Recon At Scanner  Result Date: 02/05/2017 CLINICAL DATA:  Pt stopped exam due to discomfort of the MRI couch and cough/vomiting, unable to get last sequence AX DWI. Pt has diff holding her breath as well. 83 y.o. female admitted with 1-2-week history of loss of appetite, lethargy,. Cirrhotic liver noted on ultrasound. Gallstones. Elevated liver function tests and bilirubin. EXAM: MRI ABDOMEN WITHOUT CONTRAST  (INCLUDING MRCP) TECHNIQUE: Multiplanar multisequence MR imaging of the abdomen was performed. Heavily T2-weighted images of the biliary and pancreatic ducts were obtained, and three-dimensional MRCP images were rendered by post processing. COMPARISON:  Ultrasound 02/03/2017 CT chest 02/02/2017 FINDINGS: Exam is significantly degraded by respiratory motion. No T1 imaging performed. No postcontrast  imaging performed. Lower chest:  Moderate LEFT effusion Hepatobiliary: Liver has a fine nodular contour. There are multiple round lesions within LEFT and RIGHT hepatic lobe measuring 1-2 cm which are mildly hyperintense on T2 weighted imaging. Additional round lesions which are high signal intensity on T2 weighted imaging consistent with cysts. Gallbladder distended to 3.9 cm. There is a large gallstone within the proximal aspect of the gallbladder measuring 2.9 cm. Common bile duct is normal caliber. There is no clear intrahepatic biliary duct dilatation. Pancreas: Pancreatic duct is not dilated. No peripancreatic fluid collections. Spleen: Spleen is normal volume Adrenals/urinary tract: Adrenal  glands and kidneys are normal. Stomach/Bowel: Stomach and limited of the small bowel is unremarkable Vascular/Lymphatic: Abdominal aortic normal caliber. No retroperitoneal periportal lymphadenopathy. Musculoskeletal: No aggressive osseous lesion IMPRESSION: 1. Exam is severely limited due to respiratory motion and patient discomfort. This is not uncommon in a sick elderly patient. 2. Multiple round lesions within the liver may represent regenerating nodules or dysplastic nodules. Cannot exclude hepatoma on noncontrast exam. Liver does have a fine nodular contour suggesting cirrhosis. Small amount of ascites along the RIGHT hepatic lobe. 3. No biliary duct dilatation. Large gallstone within a mildly distended gallbladder. Cannot exclude mild cholecystitis. 4. No choledocholithiasis. 5. Normal pancreas on limited exam. 6. Small RIGHT effusion. Electronically Signed   By: Suzy Bouchard M.D.   On: 02/05/2017 08:19   Medications:  Scheduled Meds: . heparin injection (subcutaneous)  5,000 Units Subcutaneous Q8H  . lactulose  30 g Oral BID  . sodium chloride flush  3 mL Intravenous Q12H   Continuous Infusions: . sodium chloride (hypertonic) 50 mL/hr (02/05/17 1010)   PRN Meds:.albuterol, bisacodyl, ondansetron **OR** ondansetron (ZOFRAN) IV, promethazine, senna-docusate   Assessment: Active Problems:   Sepsis ([place])  83 year old female admitted with loss of appetite, with GI consulted for liver cirrhosis noted on ultrasound and found to have significantly elevated liver enzymes and INR  Plan: The etiology of her acute liver failure is unknown at this time.  Based on her imaging and thrombocytopenia she has underlying liver cirrhosis.  Patient has multiple abnormalities including significantly elevated liver enzymes and coagulopathy.  This points toward liver failure.   She also has significant kidney dysfunction. She is developing multiorgan failure. Would recommend careful close management for  liver failure.  Last BMP was in April 2018 and her creatinine was normal at that time.  Kidney dysfunction is acute compared to April. Would discuss hepatorenal syndrome and appropriate workup and treatment for the same with nephrology  Her April 2018 platelets were 154, and on this admission they are 114-94.  She may have had underlying cirrhosis in the past based on low normal platelets.  Last liver enzymes and albumin were from 2014 and were normal.  No previous abdominal imaging is available.  Would recommend discussing with liver transplant center for possible inpatient transfer, as pt is likely clinically stable for transfer at this time, and and if condition continues to worsen, transfer might become difficult at that time. She may or may not be a transplant candidate but it would ultimate decision would depend on their evaluation.   Please recheck INR and correct coagulopathy at this time.  Administer vitamin K IV and recheck and correct accordingly  Nutrition is very important at this time.  If patient is unable to maintain adequate oral intake, consider enteral nutrition or Dobbhoff tube placement.  Her viral hepatitis testing and autoimmune testing has been negative. I have ordered further tests including  hep B DNA, hep C RNA, HSV, EBV, CMV testing as well.  I have ordered U/S with doppler to rule out Budd chiari syndrome. Portal vein was reported to be patent on the last abdominal U/S  Her MRI was limited by motion artifact but reported "no choledocholithiasis. No biliary duct dilatation. Large gallstone within a mildly distended gallbladder. Cannot exclude mild cholecystitis." "Multiple round lesions within the liver may represent regenerating nodules or dysplastic nodules. Cannot exclude hepatoma on noncontrast exam. Liver does have a fine nodular contour suggesting cirrhosis. Small amount of ascites along the RIGHT hepatic lobe."  Her U/S also showed a normal CBD and scattered  cystic lesions throughout the liver. Two small possible solid nodules in the right lobe.   These liver cysts/nodules will need further workup after acute liver failure improves.      LOS: 4 days   [name], MD 02/05/2017, 1:07 PM

## 2017-02-05 NOTE — NC FL2 (Signed)
[place] LEVEL OF CARE SCREENING TOOL     IDENTIFICATION  Patient Name: Alexis Barry Birthdate: [birth year] Sex: female Admission Date (Current Location): 02/01/2017  [place] and [place] Number:  Engineering geologist and Address:  [facility], [address], [place], [place] [address]      Provider Number: [id number]  Attending Physician Name and Address:  Gorden Harms, MD  Relative Name and Phone Number:  [place], [place] [phone] or Sharman Crate Daughter [phone]      Current Level of Care: Hospital Recommended Level of Care: [place] Prior Approval Number:    Date Approved/Denied:   PASRR Number: [id number]  Discharge Plan: SNF    Current Diagnoses: Patient Active Problem List   Diagnosis Date Noted  . Sepsis ([place]) 02/01/2017  . Low back pain 01/28/2017  . Bilateral leg edema 08/17/2015  . Severe tricuspid valve insufficiency 01/12/2015  . Primary malignant neoplasm of lower outer quadrant of left female breast ([place]) 08/02/2014  . Benign essential hypertension 06/03/2014  . Chronic a-fib ([place]) 02/22/2014  . Breast carcinoma ([place]) 04/06/2013  . Hyperlipidemia, mixed 04/06/2013    Orientation RESPIRATION BLADDER Height & Weight     Self, Time, Situation, Place  Normal Continent Weight: 189 lb 1.6 oz (85.8 kg) Height:  5\' 7"  (170.2 cm)  BEHAVIORAL SYMPTOMS/MOOD NEUROLOGICAL BOWEL NUTRITION STATUS      Incontinent Diet(Regular diet)  AMBULATORY STATUS COMMUNICATION OF NEEDS Skin   Limited Assist Verbally Normal                       Personal Care Assistance Level of Assistance  Feeding, Bathing, Dressing Bathing Assistance: Limited assistance Feeding assistance: Limited assistance Dressing Assistance: Limited assistance     Functional Limitations Info  Hearing, Sight, Speech Sight Info: Adequate Hearing Info: Adequate Speech Info: Adequate    SPECIAL CARE FACTORS FREQUENCY   PT (By licensed PT)                    Contractures Contractures Info: Not present    Additional Factors Info  Code Status, Allergies Code Status Info: DNR Allergies Info: NKA           Current Medications (02/05/2017):  This is the current hospital active medication list Current Facility-Administered Medications  Medication Dose Route Frequency Provider Last Rate Last Dose  . albumin human 25 % solution 25 g  25 g Intravenous BID Loney Hering D, MD 60 mL/hr at 02/05/17 1645 25 g at 02/05/17 1645  . albuterol (PROVENTIL) (2.5 MG/3ML) 0.083% nebulizer solution 2.5 mg  2.5 mg Nebulization Q2H PRN Demetrios Loll, MD      . bisacodyl (DULCOLAX) EC tablet 5 mg  5 mg Oral Daily PRN Demetrios Loll, MD      . heparin injection 5,000 Units  5,000 Units Subcutaneous Q8H Salary, Holly Bodily D, MD   5,000 Units at 02/05/17 1441  . lactulose (CHRONULAC) 10 GM/15ML solution 30 g  30 g Oral BID Loney Hering D, MD   30 g at 02/04/17 2100  . metoprolol tartrate (LOPRESSOR) tablet 12.5 mg  12.5 mg Oral BID Salary, Montell D, MD   12.5 mg at 02/05/17 1440  . ondansetron (ZOFRAN) tablet 4 mg  4 mg Oral Q6H PRN Demetrios Loll, MD       Or  . ondansetron [facility]) injection 4 mg  4 mg Intravenous Q6H PRN Demetrios Loll, MD   4 mg at 02/05/17 1706  .  promethazine (PHENERGAN) injection 6.25 mg  6.25 mg Intravenous Q6H PRN Salary, Montell D, MD   6.25 mg at 02/05/17 1344  . senna-docusate (Senokot-S) tablet 1 tablet  1 tablet Oral QHS PRN Demetrios Loll, MD      . sodium chloride flush (NS) 0.9 % injection 3 mL  3 mL Intravenous Q12H Demetrios Loll, MD   3 mL at 02/04/17 2100  . sodium chloride tablet 2 g  2 g Oral TID WC Salary, Montell D, MD         Discharge Medications: Please see discharge summary for a list of discharge medications.  Relevant Imaging Results:  Relevant Lab Results:   Additional Information SSN [id number]  Ross Ludwig, [place]

## 2017-02-05 NOTE — Progress Notes (Signed)
Pt with dark brown emesis.  Prn IV meds given.   Family at beside.  Made aware of changes in assessment.  Pt drowsy, less responsive.  Extremities cool, weeping serous sanguinous fluid.  Lungs with audible gurling.

## 2017-02-05 NOTE — Progress Notes (Signed)
[facility] NAME: Alexis Barry    MR#:  [id number]  DATE OF BIRTH:  [birth year]  SUBJECTIVE:  CHIEF COMPLAINT:   Chief Complaint  Patient presents with  . Nausea  . Emesis  . Altered Mental Status  . Hypotension  Patient continues to complain of generalized weakness/fatigue, poor p.o. intake, case discussed with gastroenterology-requested transfer to tertiary facility for further management, case discussed with [facility] pathology-states that no reason for transfer/prognosis is poor/recommended continued medical management at our facility given poor prognosis  REVIEW OF SYSTEMS:  CONSTITUTIONAL: No fever, fatigue or weakness.  EYES: No blurred or double vision.  EARS, NOSE, AND THROAT: No tinnitus or ear pain.  RESPIRATORY: No cough, shortness of breath, wheezing or hemoptysis.  CARDIOVASCULAR: No chest pain, orthopnea, edema.  GASTROINTESTINAL: No nausea, vomiting, diarrhea or abdominal pain.  GENITOURINARY: No dysuria, hematuria.  ENDOCRINE: No polyuria, nocturia,  HEMATOLOGY: No anemia, easy bruising or bleeding SKIN: No rash or lesion. MUSCULOSKELETAL: No joint pain or arthritis.   NEUROLOGIC: No tingling, numbness, weakness.  PSYCHIATRY: No anxiety or depression.   ROS  DRUG ALLERGIES:  No Known Allergies  VITALS:  Blood pressure 120/77, pulse (!) 125, temperature 97.9 F (36.6 C), temperature source Oral, resp. rate 17, height 5\' 7"  (1.702 m), weight 85.8 kg (189 lb 1.6 oz), SpO2 94 %.  PHYSICAL EXAMINATION:  GENERAL:  83 y.o.-year-old patient lying in the bed with no acute distress.  EYES: Pupils equal, round, reactive to light and accommodation. No scleral icterus. Extraocular muscles intact.  HEENT: Head atraumatic, normocephalic. Oropharynx and nasopharynx clear.  NECK:  Supple, no jugular venous distention. No thyroid enlargement, no tenderness.  LUNGS: Normal breath sounds bilaterally, no wheezing, rales,rhonchi or  crepitation. No use of accessory muscles of respiration.  CARDIOVASCULAR: S1, S2 normal. No murmurs, rubs, or gallops.  ABDOMEN: Soft, nontender, nondistended. Bowel sounds present. No organomegaly or mass.  EXTREMITIES: No pedal edema, cyanosis, or clubbing.  NEUROLOGIC: Cranial nerves II through XII are intact. Muscle strength 5/5 in all extremities. Sensation intact. Gait not checked.  PSYCHIATRIC: The patient is alert and oriented x 3.  SKIN: No obvious rash, lesion, or ulcer.   Physical Exam LABORATORY PANEL:   CBC Recent Labs  Lab 02/03/17 0419  WBC 10.5  HGB 11.7*  HCT 34.8*  PLT 94*   ------------------------------------------------------------------------------------------------------------------  Chemistries  Recent Labs  Lab 02/03/17 0419  02/05/17 0437  NA 122*   < > 125*  K 4.4   < > 4.5  CL 90*   < > 95*  CO2 21*   < > 17*  GLUCOSE 138*   < > 123*  BUN 23*   < > 31*  CREATININE 2.11*   < > 2.25*  CALCIUM 7.1*   < > 7.4*  MG 1.8  --   --   AST  --    < > 318*  ALT  --    < > 52  ALKPHOS  --    < > 392*  BILITOT  --    < > 7.7*   < > = values in this interval not displayed.   ------------------------------------------------------------------------------------------------------------------  Cardiac Enzymes Recent Labs  Lab 02/01/17 1805 02/02/17 0026  TROPONINI 0.06* 0.05*   ------------------------------------------------------------------------------------------------------------------  RADIOLOGY:  Mr Abdomen Mrcp Wo Contrast  Result Date: 02/05/2017 CLINICAL DATA:  Pt stopped exam due to discomfort of the MRI couch and cough/vomiting, unable to get last sequence AX DWI.  Pt has diff holding her breath as well. 83 y.o. female admitted with 1-2-week history of loss of appetite, lethargy,. Cirrhotic liver noted on ultrasound. Gallstones. Elevated liver function tests and bilirubin. EXAM: MRI ABDOMEN WITHOUT CONTRAST  (INCLUDING MRCP) TECHNIQUE:  Multiplanar multisequence MR imaging of the abdomen was performed. Heavily T2-weighted images of the biliary and pancreatic ducts were obtained, and three-dimensional MRCP images were rendered by post processing. COMPARISON:  Ultrasound 02/03/2017 CT chest 02/02/2017 FINDINGS: Exam is significantly degraded by respiratory motion. No T1 imaging performed. No postcontrast imaging performed. Lower chest:  Moderate LEFT effusion Hepatobiliary: Liver has a fine nodular contour. There are multiple round lesions within LEFT and RIGHT hepatic lobe measuring 1-2 cm which are mildly hyperintense on T2 weighted imaging. Additional round lesions which are high signal intensity on T2 weighted imaging consistent with cysts. Gallbladder distended to 3.9 cm. There is a large gallstone within the proximal aspect of the gallbladder measuring 2.9 cm. Common bile duct is normal caliber. There is no clear intrahepatic biliary duct dilatation. Pancreas: Pancreatic duct is not dilated. No peripancreatic fluid collections. Spleen: Spleen is normal volume Adrenals/urinary tract: Adrenal glands and kidneys are normal. Stomach/Bowel: Stomach and limited of the small bowel is unremarkable Vascular/Lymphatic: Abdominal aortic normal caliber. No retroperitoneal periportal lymphadenopathy. Musculoskeletal: No aggressive osseous lesion IMPRESSION: 1. Exam is severely limited due to respiratory motion and patient discomfort. This is not uncommon in a sick elderly patient. 2. Multiple round lesions within the liver may represent regenerating nodules or dysplastic nodules. Cannot exclude hepatoma on noncontrast exam. Liver does have a fine nodular contour suggesting cirrhosis. Small amount of ascites along the RIGHT hepatic lobe. 3. No biliary duct dilatation. Large gallstone within a mildly distended gallbladder. Cannot exclude mild cholecystitis. 4. No choledocholithiasis. 5. Normal pancreas on limited exam. 6. Small RIGHT effusion. Electronically  Signed   By: Suzy Bouchard M.D.   On: 02/05/2017 08:19   Mr 3d Recon At Scanner  Result Date: 02/05/2017 CLINICAL DATA:  Pt stopped exam due to discomfort of the MRI couch and cough/vomiting, unable to get last sequence AX DWI. Pt has diff holding her breath as well. 83 y.o. female admitted with 1-2-week history of loss of appetite, lethargy,. Cirrhotic liver noted on ultrasound. Gallstones. Elevated liver function tests and bilirubin. EXAM: MRI ABDOMEN WITHOUT CONTRAST  (INCLUDING MRCP) TECHNIQUE: Multiplanar multisequence MR imaging of the abdomen was performed. Heavily T2-weighted images of the biliary and pancreatic ducts were obtained, and three-dimensional MRCP images were rendered by post processing. COMPARISON:  Ultrasound 02/03/2017 CT chest 02/02/2017 FINDINGS: Exam is significantly degraded by respiratory motion. No T1 imaging performed. No postcontrast imaging performed. Lower chest:  Moderate LEFT effusion Hepatobiliary: Liver has a fine nodular contour. There are multiple round lesions within LEFT and RIGHT hepatic lobe measuring 1-2 cm which are mildly hyperintense on T2 weighted imaging. Additional round lesions which are high signal intensity on T2 weighted imaging consistent with cysts. Gallbladder distended to 3.9 cm. There is a large gallstone within the proximal aspect of the gallbladder measuring 2.9 cm. Common bile duct is normal caliber. There is no clear intrahepatic biliary duct dilatation. Pancreas: Pancreatic duct is not dilated. No peripancreatic fluid collections. Spleen: Spleen is normal volume Adrenals/urinary tract: Adrenal glands and kidneys are normal. Stomach/Bowel: Stomach and limited of the small bowel is unremarkable Vascular/Lymphatic: Abdominal aortic normal caliber. No retroperitoneal periportal lymphadenopathy. Musculoskeletal: No aggressive osseous lesion IMPRESSION: 1. Exam is severely limited due to respiratory motion and patient discomfort. This  is not uncommon  in a sick elderly patient. 2. Multiple round lesions within the liver may represent regenerating nodules or dysplastic nodules. Cannot exclude hepatoma on noncontrast exam. Liver does have a fine nodular contour suggesting cirrhosis. Small amount of ascites along the RIGHT hepatic lobe. 3. No biliary duct dilatation. Large gallstone within a mildly distended gallbladder. Cannot exclude mild cholecystitis. 4. No choledocholithiasis. 5. Normal pancreas on limited exam. 6. Small RIGHT effusion. Electronically Signed   By: Suzy Bouchard M.D.   On: 02/05/2017 08:19    ASSESSMENT AND PLAN:  1acute liver failure  In discussion with [facility] pathology-most likely secondary to acute on chronic liver injury, recommended to discontinue 3% sodium, start IV albumin twice daily, no indication for transfer given poor prognosis  Liver ultrasound noted for cirrhosis/liver nodules, acute hepatitis panel negative, gastroenterology following-had recommended transfer-but transfer request denied by [facility] pathology/see above, echocardiogram noted for normal ejection fraction, MRCP noted for cirrhosis/kinetic exclude possible hepatoma given noncontrast study, check INR-treatment with vitamin K as needed  2 acute tubular necrosis Stable Nephrology following, avoid hypotension/nephrotoxic agents, strict I&O monitoring, daily weights, renal ultrasound was unimpressive  3 acute moderate to severe hyponatremia Resolving Discontinue 3% saline as directed by [facility] pathology, start salt tabs 3 times daily, BMP daily  4 acute possible UTI Urine cultures negative Antibiotics discontinued  5acute lactic acidosis Suspect may be related to acute tubular necrosis and acute liver failure   6 acute on chronic paratracheal mass Secondary to partially resected large goiter Stable Conservative observation/management CT/chest x-ray noted  TSH normal  7acute elevated troponins Continue apixaban  Cardiology input  appreciated-no further intervention/workup necessary per cardiology, echocardiogram noted for preserved left ventricular systolic ejection fraction/function/no right-sided dysfunction, restart low-dose beta-blocker therapy  8History of chronic A. Fib Stable-Eliquis discontinued given acute liver failure, check INR   Cardizem/beta-blocker therapy initially held for bradycardia-restart beta-blocker therapy low dose   9chronic benign essential hypertension Stable on current regimen  10acute generalized weakness, persistent Most likely secondary tochronic deconditioning, acute liver failure, acute tubular necrosis Physical therapy following  11persistent nausea Resolved patient is  Acute abdominal series unremarkable  Continue antiemetics PRN Most likely secondary to acute hepatorenal syndrome  Condition stable Long-term prognosis poor   All the records are reviewed and case discussed with Care Management/Social Workerr. Management plans discussed with the patient, family and they are in agreement.  CODE STATUS: dnr  TOTAL TIME TAKING CARE OF THIS PATIENT: 45 minutes of critical care time was used to 79 discuss case with multiple subspecialists, [facility] pathologist, multiple family discussions.     POSSIBLE D/C IN 2-5 DAYS, DEPENDING ON CLINICAL CONDITION.   Avel Peace Diana Armijo M.D on 02/05/2017   Between 7am to 6pm - Pager - [phone]  After 6pm go to [email or web address] - password [email or web address] [place] [facility]  Office  [phone]  CC: Primary care physician; Lavera Guise, MD  Note: This dictation was prepared with Dragon dictation along with smaller phrase technology. Any transcriptional errors that result from this process are unintentional.

## 2017-02-06 ENCOUNTER — Inpatient Hospital Stay: Payer: Medicare Other

## 2017-02-06 DIAGNOSIS — E876 Hypokalemia: Secondary | ICD-10-CM

## 2017-02-06 DIAGNOSIS — Z7189 Other specified counseling: Secondary | ICD-10-CM

## 2017-02-06 DIAGNOSIS — I820 Budd-Chiari syndrome: Secondary | ICD-10-CM

## 2017-02-06 DIAGNOSIS — Z515 Encounter for palliative care: Secondary | ICD-10-CM

## 2017-02-06 DIAGNOSIS — N179 Acute kidney failure, unspecified: Secondary | ICD-10-CM

## 2017-02-06 DIAGNOSIS — R1110 Vomiting, unspecified: Secondary | ICD-10-CM

## 2017-02-06 LAB — COMPREHENSIVE METABOLIC PANEL
ALT: 44 U/L (ref 14–54)
ANION GAP: 14 (ref 5–15)
AST: 248 U/L — ABNORMAL HIGH (ref 15–41)
Albumin: 2.9 g/dL — ABNORMAL LOW (ref 3.5–5.0)
Alkaline Phosphatase: 338 U/L — ABNORMAL HIGH (ref 38–126)
BUN: 37 mg/dL — ABNORMAL HIGH (ref 6–20)
CHLORIDE: 93 mmol/L — AB (ref 101–111)
CO2: 19 mmol/L — ABNORMAL LOW (ref 22–32)
CREATININE: 2.24 mg/dL — AB (ref 0.44–1.00)
Calcium: 7.7 mg/dL — ABNORMAL LOW (ref 8.9–10.3)
GFR calc Af Amer: 23 mL/min — ABNORMAL LOW (ref >60)
GFR, EST NON AFRICAN AMERICAN: 20 mL/min — AB (ref >60)
Glucose, Bld: 69 mg/dL (ref 65–99)
Potassium: 4.5 mmol/L (ref 3.5–5.1)
SODIUM: 126 mmol/L — AB (ref 135–145)
Total Bilirubin: 10.1 mg/dL — ABNORMAL HIGH (ref 0.3–1.2)
Total Protein: 4.5 g/dL — ABNORMAL LOW (ref 6.5–8.1)

## 2017-02-06 LAB — AFP TUMOR MARKER: AFP, SERUM, TUMOR MARKER: 1.9 ng/mL (ref 0.0–8.3)

## 2017-02-06 LAB — HSV(HERPES SIMPLEX VRS) I + II AB-IGG: HSV 1 Glycoprotein G Ab, IgG: 36.1 index — ABNORMAL HIGH (ref 0.00–0.90)

## 2017-02-06 LAB — CBC
HCT: 31.1 % — ABNORMAL LOW (ref 35.0–47.0)
HEMOGLOBIN: 10.4 g/dL — AB (ref 12.0–16.0)
MCH: 29.8 pg (ref 26.0–34.0)
MCHC: 33.6 g/dL (ref 32.0–36.0)
MCV: 88.9 fL (ref 80.0–100.0)
PLATELETS: 49 10*3/uL — AB (ref 150–440)
RBC: 3.5 MIL/uL — AB (ref 3.80–5.20)
RDW: 16.1 % — ABNORMAL HIGH (ref 11.5–14.5)
WBC: 10.1 10*3/uL (ref 3.6–11.0)

## 2017-02-06 LAB — CULTURE, BLOOD (ROUTINE X 2)
CULTURE: NO GROWTH
Culture: NO GROWTH
SPECIAL REQUESTS: ADEQUATE

## 2017-02-06 LAB — EBV AB TO VIRAL CAPSID AG PNL, IGG+IGM: EBV VCA IgG: 95.2 U/mL — ABNORMAL HIGH (ref 0.0–17.9)

## 2017-02-06 LAB — HSV(HERPES SIMPLEX VRS) I + II AB-IGM: HSVI/II Comb IgM: 1.98 Ratio — ABNORMAL HIGH (ref 0.00–0.90)

## 2017-02-06 LAB — HEPATITIS B DNA, ULTRAQUANTITATIVE, PCR
HBV DNA SERPL PCR-ACNC: NOT DETECTED [IU]/mL
HBV DNA SERPL PCR-LOG IU: UNDETERMINED log10 IU/mL

## 2017-02-06 LAB — CMV DNA, QUANTITATIVE, PCR
CMV DNA Quant: NEGATIVE IU/mL
Log10 CMV Qn DNA Pl: UNDETERMINED log10 IU/mL

## 2017-02-06 LAB — HEPATITIS B E ANTIBODY: HEP B E AB: NEGATIVE

## 2017-02-06 LAB — CMV IGM: CMV IGM: 47.1 [AU]/ml — AB (ref 0.0–29.9)

## 2017-02-06 MED ORDER — BIOTENE DRY MOUTH MT LIQD: 15.0000 mL | OROMUCOSAL | Status: DC | PRN

## 2017-02-06 MED ORDER — HALOPERIDOL LACTATE 2 MG/ML PO CONC
0.5000 mg | ORAL | Status: DC | PRN
  Filled 2017-02-06: qty 0.3

## 2017-02-06 MED ORDER — HALOPERIDOL LACTATE 2 MG/ML PO CONC: 0.5000 mg | ORAL | 0 refills | Status: AC | PRN

## 2017-02-06 MED ORDER — LORAZEPAM 2 MG/ML PO CONC: 0.5000 mg | Freq: Three times a day (TID) | ORAL | 0 refills | Status: AC

## 2017-02-06 MED ORDER — GLYCOPYRROLATE 1 MG PO TABS
1.0000 mg | ORAL_TABLET | ORAL | Status: DC | PRN
  Filled 2017-02-06: qty 1

## 2017-02-06 MED ORDER — ONDANSETRON HCL 4 MG/2ML IJ SOLN
4.0000 mg | Freq: Four times a day (QID) | INTRAMUSCULAR | Status: DC | PRN
  Administered 2017-02-06: 4 mg via INTRAVENOUS
  Filled 2017-02-06: qty 2

## 2017-02-06 MED ORDER — ONDANSETRON 4 MG PO TBDP
4.0000 mg | ORAL_TABLET | Freq: Four times a day (QID) | ORAL | Status: DC | PRN
  Filled 2017-02-06: qty 1

## 2017-02-06 MED ORDER — ACETAMINOPHEN 650 MG RE SUPP: 650.0000 mg | Freq: Four times a day (QID) | RECTAL | Status: DC | PRN

## 2017-02-06 MED ORDER — HALOPERIDOL LACTATE 5 MG/ML IJ SOLN: 0.5000 mg | INTRAMUSCULAR | Status: DC | PRN

## 2017-02-06 MED ORDER — POLYVINYL ALCOHOL 1.4 % OP SOLN
1.0000 [drp] | Freq: Four times a day (QID) | OPHTHALMIC | Status: DC | PRN
  Filled 2017-02-06: qty 15

## 2017-02-06 MED ORDER — GLYCOPYRROLATE 0.2 MG/ML IJ SOLN
0.2000 mg | INTRAMUSCULAR | Status: DC | PRN
  Filled 2017-02-06: qty 1

## 2017-02-06 MED ORDER — MORPHINE SULFATE (PF) 2 MG/ML IV SOLN: 2.0000 mg | INTRAVENOUS | Status: DC | PRN

## 2017-02-06 MED ORDER — GLYCOPYRROLATE 1 MG PO TABS: 1.0000 mg | ORAL_TABLET | ORAL | 0 refills | Status: AC | PRN

## 2017-02-06 MED ORDER — ACETAMINOPHEN 325 MG PO TABS: 650.0000 mg | ORAL_TABLET | Freq: Four times a day (QID) | ORAL | Status: DC | PRN

## 2017-02-06 MED ORDER — MORPHINE SULFATE (CONCENTRATE) 10 MG /0.5 ML PO SOLN: 5.0000 mg | ORAL | 0 refills | Status: AC | PRN

## 2017-02-06 MED ORDER — HALOPERIDOL 0.5 MG PO TABS
0.5000 mg | ORAL_TABLET | ORAL | Status: DC | PRN
  Filled 2017-02-06: qty 1

## 2017-02-06 MED ORDER — ONDANSETRON 4 MG PO TBDP: 4.0000 mg | ORAL_TABLET | Freq: Four times a day (QID) | ORAL | 0 refills | Status: AC | PRN

## 2017-02-06 NOTE — Clinical Social Work Note (Signed)
CSW received consult that palliative is recommending hospice facility placement.  CSW spoke to patient's daughter and they have chosen [facility].  CSW contacted [facility] nurse liaison who said there is a bed available for today.  Jones Broom. [place], MSW, [place]  02/06/2017 4:12 PM

## 2017-02-06 NOTE — Progress Notes (Signed)
Patient was discharge via EMS to [facility]. Discontinue telemetry monitor. Patient had her peripheral IV in her left EJ upon discharge. Santiago Glad, RN hospice nurse called report.

## 2017-02-06 NOTE — Progress Notes (Signed)
PT Cancellation Note  Patient Details Name: Alexis Barry MRN: [id number] DOB: 01/19/1935   Cancelled Treatment:    Reason Eval/Treat Not Completed: Medical issues which prohibited therapy  Chart reviewed.  Will hold session this am and continue as appropriate.  Chesley Noon 02/06/2017, 8:30 AM

## 2017-02-06 NOTE — Consult Note (Signed)
Consultation Note Date: 02/06/2017   Patient Name: Alexis Barry  DOB: [birth year]  MRN: [id number]  Age / Sex: 83 y.o., female  PCP: Lavera Guise, MD Referring Physician: Gorden Harms, MD  Reason for Consultation: Establishing goals of care and Terminal Care  HPI/Patient Profile: Alexis Barry  is a 83 y.o. female with a known history of multiple medical problems as below.  Patient has had a nausea, vomiting and diarrhea for the past 3 days.   Clinical Assessment and Goals of Care: Ms. Outten is resting in bed. She is alert and able to answer questions regarding care. She states she had been feeling well and then developed nausea and diarrhea. She became worse and came to the hospital. Her granddaughter who is at bedside states this information is correct. Consulted for hospice level care due to liver and renal failure with poor prognosis. Spoke with son who is POA and daughter who request comfort care.       SUMMARY OF RECOMMENDATIONS    Transfer to hospice.    Code Status/Advance Care Planning:  DNR    Symptom Management:   EOL order set in place.   Palliative Prophylaxis:   Oral Care  Prognosis:   < 2 weeks  Discharge Planning: Hospice facility      Primary Diagnoses: Present on Admission: . Sepsis ([place])   I have reviewed the medical record, interviewed the patient and family, and examined the patient. The following aspects are pertinent.  Past Medical History:  Diagnosis Date  . Atrial fibrillation ([place])   . Breast cancer ([place]) 08/21/12   left mastectomy  . Cancer [facility])    Left breast cancer s/p left mastectomy, chemo and radiation  . CHF (congestive heart failure) ([place])   . Goiter   . Hypertension   . Mild dementia   . Personal history of chemotherapy   . Personal history of radiation therapy    Social History   Socioeconomic History  . Marital status:  Widowed    Spouse name: None  . Number of children: None  . Years of education: None  . Highest education level: None  Social Needs  . Financial resource strain: None  . Food insecurity - worry: None  . Food insecurity - inability: None  . Transportation needs - medical: None  . Transportation needs - non-medical: None  Occupational History  . None  Tobacco Use  . Smoking status: Never Smoker  . Smokeless tobacco: Never Used  Substance and Sexual Activity  . Alcohol use: No  . Drug use: No  . Sexual activity: None  Other Topics Concern  . None  Social History Narrative  . None   Family History  Problem Relation Age of Onset  . Breast cancer Maternal Aunt   . CAD Father    Scheduled Meds: . furosemide  60 mg Intravenous Once  . sodium chloride flush  3 mL Intravenous Q12H   Continuous Infusions: PRN Meds:.acetaminophen **OR** acetaminophen, albuterol, antiseptic oral rinse, glycopyrrolate **OR** glycopyrrolate **OR**  glycopyrrolate, haloperidol **OR** haloperidol **OR** haloperidol lactate, ondansetron **OR** ondansetron (ZOFRAN) IV, polyvinyl alcohol, sodium chloride flush Medications Prior to Admission:  Prior to Admission medications   Medication Sig Start Date End Date Taking? Authorizing Provider  apixaban (ELIQUIS) 5 MG TABS tablet Take 5 mg by mouth 2 (two) times daily.   Yes [provider]  diltiazem (CARDIZEM CD) 180 MG 24 hr capsule Take 180 mg by mouth daily.   Yes [provider]  furosemide (LASIX) 20 MG tablet Take 40 mg by mouth every morning.   Yes [provider]  letrozole (FEMARA) 2.5 MG tablet TAKE 1 TABLET(2.5 MG) BY MOUTH DAILY 01/20/17  Yes Lloyd Huger, MD  metoprolol (LOPRESSOR) 50 MG tablet Take 50 mg by mouth 2 (two) times daily.   Yes [provider]  rosuvastatin (CRESTOR) 10 MG tablet Take 10 mg by mouth daily.   Yes [provider]  acetaminophen (TYLENOL) 500 MG tablet Take 500 mg by mouth  every 6 (six) hours as needed.    [provider]  alendronate (FOSAMAX) 70 MG tablet TAKE 1 TABLET(70 MG) BY MOUTH 1 TIME A WEEK WITH A FULL GLASS OF WATER AND ON AN EMPTY STOMACH Patient not taking: Reported on 02/01/2017 12/10/16   Lloyd Huger, MD   No Known Allergies Review of Systems  All other systems reviewed and are negative.   Physical Exam  Pulmonary/Chest: Effort normal.  Musculoskeletal: She exhibits edema.  Neurological: She is alert.  Answers questions appropriately.   Skin: Skin is warm and dry.    Vital Signs: BP (!) 82/46 (BP Location: Right Arm)   Pulse (!) 121   Temp (!) 97.1 F (36.2 C) (Axillary)   Resp 18   Ht 5\' 7"  (1.702 m)   Wt 89.2 kg (196 lb 11.2 oz)   SpO2 95%   BMI 30.81 kg/m  Pain Assessment: No/denies pain POSS *See Group Information*: 1-Acceptable,Awake and alert Pain Score: Asleep   SpO2: SpO2: 95 % O2 Device:SpO2: 95 % O2 Flow Rate: .O2 Flow Rate (L/min): 2 L/min  IO: Intake/output summary:   Intake/Output Summary (Last 24 hours) at 02/06/2017 1537 Last data filed at 02/06/2017 0500 Gross per 24 hour  Intake 200 ml  Output 100 ml  Net 100 ml    LBM: Last BM Date: 02/06/17 Baseline Weight: Weight: 72.6 kg (160 lb) Most recent weight: Weight: 89.2 kg (196 lb 11.2 oz)     Palliative Assessment/Data: 20%     Time In: 3:00 Time Out: 3:50 Time Total: 50 min Greater than 50%  of this time was spent counseling and coordinating care related to the above assessment and plan.  Signed by: Asencion Gowda, NP   Please contact Palliative Medicine Team phone at [phone] for questions and concerns.  For individual provider: See Shea Evans

## 2017-02-06 NOTE — Progress Notes (Signed)
[facility] [place] NAME: Alexis Barry    MR#:  [id number]  DATE OF BIRTH:  [birth year]  SUBJECTIVE:  CHIEF COMPLAINT:   Chief Complaint  Patient presents with  . Nausea  . Emesis  . Altered Mental Status  . Hypotension  Extensive family discussion had with the patient patient's brother, the patient's daughter, the patient's grandchildren regarding dismal prognosis from acute worsening liver failure, all questions answered, palliative care consult placed as hospice is strongly recommended-family is agreeable  REVIEW OF SYSTEMS:  CONSTITUTIONAL: No fever, fatigue or weakness.  EYES: No blurred or double vision.  EARS, NOSE, AND THROAT: No tinnitus or ear pain.  RESPIRATORY: No cough, shortness of breath, wheezing or hemoptysis.  CARDIOVASCULAR: No chest pain, orthopnea, edema.  GASTROINTESTINAL: No nausea, vomiting, diarrhea or abdominal pain.  GENITOURINARY: No dysuria, hematuria.  ENDOCRINE: No polyuria, nocturia,  HEMATOLOGY: No anemia, easy bruising or bleeding SKIN: No rash or lesion. MUSCULOSKELETAL: No joint pain or arthritis.   NEUROLOGIC: No tingling, numbness, weakness.  PSYCHIATRY: No anxiety or depression.   ROS  DRUG ALLERGIES:  No Known Allergies  VITALS:  Blood pressure (!) 82/46, pulse (!) 121, temperature (!) 97.1 F (36.2 C), temperature source Axillary, resp. rate 18, height 5\' 7"  (1.702 m), weight 89.2 kg (196 lb 11.2 oz), SpO2 95 %.  PHYSICAL EXAMINATION:  GENERAL:  83 y.o.-year-old patient lying in the bed with no acute distress.  Frail appearance EYES: Pupils equal, round, reactive to light and accommodation. No scleral icterus. Extraocular muscles intact.  HEENT: Head atraumatic, normocephalic. Oropharynx and nasopharynx clear.  NECK:  Supple, no jugular venous distention. No thyroid enlargement, no tenderness.  LUNGS: Normal breath sounds bilaterally, no wheezing, rales,rhonchi or crepitation. No use of  accessory muscles of respiration.  CARDIOVASCULAR: S1, S2 normal. No murmurs, rubs, or gallops.  ABDOMEN: Soft, nontender, nondistended. Bowel sounds present. No organomegaly or mass.  EXTREMITIES: Severe anasarca, cyanosis, or clubbing.  NEUROLOGIC: Cranial nerves II through XII are intact. MAES. Gait not checked.  PSYCHIATRIC: The patient is lethargic but is oriented x3.  SKIN: Jaundiced appearance   Physical Exam LABORATORY PANEL:   CBC Recent Labs  Lab 02/06/17 0615  WBC 10.1  HGB 10.4*  HCT 31.1*  PLT 49*   ------------------------------------------------------------------------------------------------------------------  Chemistries  Recent Labs  Lab 02/03/17 0419  02/06/17 0615  NA 122*   < > 126*  K 4.4   < > 4.5  CL 90*   < > 93*  CO2 21*   < > 19*  GLUCOSE 138*   < > 69  BUN 23*   < > 37*  CREATININE 2.11*   < > 2.24*  CALCIUM 7.1*   < > 7.7*  MG 1.8  --   --   AST  --    < > 248*  ALT  --    < > 44  ALKPHOS  --    < > 338*  BILITOT  --    < > 10.1*   < > = values in this interval not displayed.   ------------------------------------------------------------------------------------------------------------------  Cardiac Enzymes Recent Labs  Lab 02/01/17 1805 02/02/17 0026  TROPONINI 0.06* 0.05*   ------------------------------------------------------------------------------------------------------------------  RADIOLOGY:  Mr Abdomen Mrcp Wo Contrast  Result Date: 02/05/2017 CLINICAL DATA:  Pt stopped exam due to discomfort of the MRI couch and cough/vomiting, unable to get last sequence AX DWI. Pt has diff holding her breath as well. 83 y.o. female admitted with  1-2-week history of loss of appetite, lethargy,. Cirrhotic liver noted on ultrasound. Gallstones. Elevated liver function tests and bilirubin. EXAM: MRI ABDOMEN WITHOUT CONTRAST  (INCLUDING MRCP) TECHNIQUE: Multiplanar multisequence MR imaging of the abdomen was performed. Heavily T2-weighted  images of the biliary and pancreatic ducts were obtained, and three-dimensional MRCP images were rendered by post processing. COMPARISON:  Ultrasound 02/03/2017 CT chest 02/02/2017 FINDINGS: Exam is significantly degraded by respiratory motion. No T1 imaging performed. No postcontrast imaging performed. Lower chest:  Moderate LEFT effusion Hepatobiliary: Liver has a fine nodular contour. There are multiple round lesions within LEFT and RIGHT hepatic lobe measuring 1-2 cm which are mildly hyperintense on T2 weighted imaging. Additional round lesions which are high signal intensity on T2 weighted imaging consistent with cysts. Gallbladder distended to 3.9 cm. There is a large gallstone within the proximal aspect of the gallbladder measuring 2.9 cm. Common bile duct is normal caliber. There is no clear intrahepatic biliary duct dilatation. Pancreas: Pancreatic duct is not dilated. No peripancreatic fluid collections. Spleen: Spleen is normal volume Adrenals/urinary tract: Adrenal glands and kidneys are normal. Stomach/Bowel: Stomach and limited of the small bowel is unremarkable Vascular/Lymphatic: Abdominal aortic normal caliber. No retroperitoneal periportal lymphadenopathy. Musculoskeletal: No aggressive osseous lesion IMPRESSION: 1. Exam is severely limited due to respiratory motion and patient discomfort. This is not uncommon in a sick elderly patient. 2. Multiple round lesions within the liver may represent regenerating nodules or dysplastic nodules. Cannot exclude hepatoma on noncontrast exam. Liver does have a fine nodular contour suggesting cirrhosis. Small amount of ascites along the RIGHT hepatic lobe. 3. No biliary duct dilatation. Large gallstone within a mildly distended gallbladder. Cannot exclude mild cholecystitis. 4. No choledocholithiasis. 5. Normal pancreas on limited exam. 6. Small RIGHT effusion. Electronically Signed   By: Suzy Bouchard M.D.   On: 02/05/2017 08:19   Mr 3d Recon At  Scanner  Result Date: 02/05/2017 CLINICAL DATA:  Pt stopped exam due to discomfort of the MRI couch and cough/vomiting, unable to get last sequence AX DWI. Pt has diff holding her breath as well. 83 y.o. female admitted with 1-2-week history of loss of appetite, lethargy,. Cirrhotic liver noted on ultrasound. Gallstones. Elevated liver function tests and bilirubin. EXAM: MRI ABDOMEN WITHOUT CONTRAST  (INCLUDING MRCP) TECHNIQUE: Multiplanar multisequence MR imaging of the abdomen was performed. Heavily T2-weighted images of the biliary and pancreatic ducts were obtained, and three-dimensional MRCP images were rendered by post processing. COMPARISON:  Ultrasound 02/03/2017 CT chest 02/02/2017 FINDINGS: Exam is significantly degraded by respiratory motion. No T1 imaging performed. No postcontrast imaging performed. Lower chest:  Moderate LEFT effusion Hepatobiliary: Liver has a fine nodular contour. There are multiple round lesions within LEFT and RIGHT hepatic lobe measuring 1-2 cm which are mildly hyperintense on T2 weighted imaging. Additional round lesions which are high signal intensity on T2 weighted imaging consistent with cysts. Gallbladder distended to 3.9 cm. There is a large gallstone within the proximal aspect of the gallbladder measuring 2.9 cm. Common bile duct is normal caliber. There is no clear intrahepatic biliary duct dilatation. Pancreas: Pancreatic duct is not dilated. No peripancreatic fluid collections. Spleen: Spleen is normal volume Adrenals/urinary tract: Adrenal glands and kidneys are normal. Stomach/Bowel: Stomach and limited of the small bowel is unremarkable Vascular/Lymphatic: Abdominal aortic normal caliber. No retroperitoneal periportal lymphadenopathy. Musculoskeletal: No aggressive osseous lesion IMPRESSION: 1. Exam is severely limited due to respiratory motion and patient discomfort. This is not uncommon in a sick elderly patient. 2. Multiple round lesions within  the liver may  represent regenerating nodules or dysplastic nodules. Cannot exclude hepatoma on noncontrast exam. Liver does have a fine nodular contour suggesting cirrhosis. Small amount of ascites along the RIGHT hepatic lobe. 3. No biliary duct dilatation. Large gallstone within a mildly distended gallbladder. Cannot exclude mild cholecystitis. 4. No choledocholithiasis. 5. Normal pancreas on limited exam. 6. Small RIGHT effusion. Electronically Signed   By: Suzy Bouchard M.D.   On: 02/05/2017 08:19   US Liver Doppler  Result Date: 02/06/2017 CLINICAL DATA:  Hepatic cirrhosis EXAM: DUPLEX ULTRASOUND OF LIVER TECHNIQUE: Color and duplex Doppler ultrasound was performed to evaluate the hepatic in-flow and out-flow vessels. COMPARISON:  Abdominal MRI February 05, 2017; ultrasound right upper quadrant February 01, 2017 FINDINGS: Portal Vein Velocities Main: 26.5 cm/sec proximally; 19.3 centimeter/second mid; 22.4 centimeter/second distally Right:  19.9 cm/sec Left:  16.8 cm/sec Portal vein flow is reversed with flow away from the liver. Hepatic Vein Velocities Right:  11.5 cm/sec Middle:  10.2 cm/sec Left:  11.7 cm/sec Flow in the hepatic veins in the anatomic direction. Hepatic Artery Velocity:  157.8 cm/sec Splenic Vein Velocity:  17.9 cm/sec Varices: None Ascites: None Spleen measures 5.0 x 9.8 x 3.8 cm, normal. The liver has an appearance consistent with cirrhosis as noted on recent ultrasound. No thrombus or occlusion noted in the portal or splenic veins. IMPRESSION: Reversal of flow direction in the portal veins. Hepatic vein flow in the anatomic direction. Liver has an appearance consistent with underlying cirrhosis. No portal or splenic vein thrombus or occlusion evident. No varices or ascites evident. No splenomegaly. Electronically Signed   By: Lowella Grip III M.D.   On: 02/06/2017 10:27    ASSESSMENT AND PLAN:  1acute liver failure  Persistent worsening In discussion with [facility] pathology-most likely  secondary to acute on chronic liver injury, recommended to discontinue 3% sodium, IV albumin twice daily, no indication for transfer given poor prognosis  Liver ultrasound noted for cirrhosis/liver nodules, acute hepatitis panel negative, gastroenterology following-had recommended transfer-but transfer request denied by [facility] as well as [facility] hepatology service given grave prognosis, echocardiogram noted for normal ejection fraction, MRCP noted for cirrhosis/kinetic exclude possible hepatoma given noncontrast study, long conversation was had with the patient's numerous family members as well as the patient, hospice recommended going forward-family is agreeable, all questions were answered  2acute tubular necrosis Stable Case discussed with nephrology-also recommended comfort care/hospice going forward Continue to avoid hypotension/nephrotoxic agents, renal ultrasound was unimpressive  3acute moderate to severe hyponatremia Improved Discontinue 3% saline as directed by [facility] pathology, continue salt tabs 3 times daily  4acute possible UTI Ruled out Urine cultures negative Antibiotics discontinued  5acute lactic acidosis Suspect may be related to acute tubular necrosis and acute liver failure  6acute on chronic paratracheal mass Secondary to partially resected large goiter Stable Conservative observation/management CT/chest x-ray noted  TSH normal  7acute elevated troponins Cardiology did see patient while in house-no further intervention/workup necessary per cardiology, echocardiogram noted for preserved left ventricular systolic ejection fraction/function/no right-sided dysfunction  8History of chronic A. Fib Stable-Eliquis discontinued given acute liver failure INR 2.9 Cardizem/beta-blocker therapy  held for bradycardia as well as hypotension  9chronic benign essential hypertension Currently hypotensive Discontinue antihypertensive agents  Condition guarded   Prognosis terminal   All the records are reviewed and case discussed with Care Management/Social Workerr. Management plans discussed with the patient, family and they are in agreement.  CODE STATUS: dnr  TOTAL TIME TAKING CARE OF THIS PATIENT: 45 minutes  of critical care time was used to speak with consultants, family member meeting with patient as well.     POSSIBLE D/C IN 1-3 DAYS, DEPENDING ON CLINICAL CONDITION.   Avel Peace Annaston Upham M.D on 02/06/2017   Between 7am to 6pm - Pager - [phone]  After 6pm go to [email or web address] - password [email or web address] [facility]  Office  [phone]  CC: Primary care physician; Lavera Guise, MD  Note: This dictation was prepared with Dragon dictation along with smaller phrase technology. Any transcriptional errors that result from this process are unintentional.

## 2017-02-06 NOTE — Discharge Summary (Addendum)
[facility] [facility] [place] NAME: Alexis Barry    MR#:  [id number]  DATE OF BIRTH:  25-Nov-1934  DATE OF ADMISSION:  02/01/2017 ADMITTING PHYSICIAN: Demetrios Loll, MD  DATE OF DISCHARGE: No discharge date for patient encounter.  PRIMARY CARE PHYSICIAN: Lavera Guise, MD    ADMISSION DIAGNOSIS:  Hypokalemia [E87.6] Hyponatremia [E87.1] Troponin level elevated [R74.8] LFT elevation [R94.5] Sepsis, due to unspecified organism [facility]) [A41.9] Acute renal failure, unspecified acute renal failure type ([place]) [N17.9] Sepsis ([place]) [A41.9]  DISCHARGE DIAGNOSIS:  Acute liver failure Acute renal failure Acute severe hyponatremia Acute anasarca Acute lactic acidosis   SECONDARY DIAGNOSIS:   Past Medical History:  Diagnosis Date  . Atrial fibrillation ([place])   . Breast cancer ([place]) 08/21/12   left mastectomy  . Cancer [facility])    Left breast cancer s/p left mastectomy, chemo and radiation  . CHF (congestive heart failure) ([place])   . Goiter   . Hypertension   . Mild dementia   . Personal history of chemotherapy   . Personal history of radiation therapy     HOSPITAL COURSE:  1acute liver failure  Persistent worsening In discussion with [facility] pathology-most likely secondary to acute on chronic liver injury, recommended to discontinue 3% sodium, IV albumin twice daily, no indication for transfer given poor prognosis Liver ultrasound noted for cirrhosis/liver nodules, acute hepatitis panel negative, gastroenterologyfollowing-had recommended transfer-but transfer request denied by [facility] as well as [facility] hepatology service given grave prognosis, echocardiogram noted for normal ejection fraction, MRCP noted for cirrhosis/kinetic exclude possible hepatoma given noncontrast study, long conversation was had with the patient's numerous family members as well as the patient, hospice recommended going forward-family is agreeable, all questions were answered, accepted  [facility] on day of discharge  2acute tubular necrosis Stable Case discussed with nephrology-also recommended comfort care/hospice going forward Avoided nephrotoxic agents, renal ultrasound was unimpressive  3acute moderate to severe hyponatremia Improved Treated with short course of 3% saline as well as salt tabs   4acute possible UTI Ruled out Urine cultures negative Antibiotics discontinued  5acute lactic acidosis Suspect may be related to acute tubular necrosis and acute liver failure  6acute on chronic paratracheal mass Secondary to partially resected large goiter Stable Conservative observation/management CT/chest x-ray noted  TSH normal  7acute elevated troponins Cardiology did see patient while in house-no further intervention/workup necessary per cardiology, echocardiogram noted for preserved left ventricular systolic ejection fraction/function/no right-sided dysfunction  8History of chronic A. Fib Stable-Eliquisdiscontinued given acute liver failure INR 2.9 Cardizem/beta-blocker therapy held for bradycardia as well as hypotension  9chronic benign essential hypertension Currently hypotensive Discontinued antihypertensive agents  Condition guarded  Prognosis terminal     DISCHARGE CONDITIONS:   Condition guarded, prognosis terminal, discharged to hospice [facility], hospice/palliative care to continue to follow  CONSULTS OBTAINED:  Treatment Team:  Corey Skains, MD Virgel Manifold, MD Murlean Iba, MD  DRUG ALLERGIES:  No Known Allergies  DISCHARGE MEDICATIONS:   Allergies as of 02/06/2017   No Known Allergies     Medication List    STOP taking these medications   acetaminophen 500 MG tablet Commonly known as:  TYLENOL   alendronate 70 MG tablet Commonly known as:  FOSAMAX   apixaban 5 MG Tabs tablet Commonly known as:  ELIQUIS   diltiazem 180 MG 24 hr capsule Commonly known as:  CARDIZEM CD    furosemide 20 MG tablet Commonly known as:  LASIX   letrozole  2.5 MG tablet Commonly known as:  FEMARA   metoprolol tartrate 50 MG tablet Commonly known as:  LOPRESSOR   rosuvastatin 10 MG tablet Commonly known as:  CRESTOR     TAKE these medications   glycopyrrolate 1 MG tablet Commonly known as:  ROBINUL Take 1 tablet (1 mg total) by mouth every 4 (four) hours as needed (excessive secretions).   haloperidol 2 MG/ML solution Commonly known as:  HALDOL Place 0.3 mLs (0.6 mg total) under the tongue every 4 (four) hours as needed for agitation (or delirium).   LORazepam 2 MG/ML concentrated solution Commonly known as:  ATIVAN Take 0.3 mLs (0.6 mg total) by mouth every 8 (eight) hours.   morphine CONCENTRATE 10 mg / 0.5 ml concentrated solution Take 0.25 mLs (5 mg total) by mouth every 2 (two) hours as needed for severe pain.   ondansetron 4 MG disintegrating tablet Commonly known as:  ZOFRAN-ODT Take 1 tablet (4 mg total) by mouth every 6 (six) hours as needed for nausea.        DISCHARGE INSTRUCTIONS:    If you experience worsening of your admission symptoms, develop shortness of breath, life threatening emergency, suicidal or homicidal thoughts you must seek medical attention immediately by calling 911 or calling your MD immediately  if symptoms less severe.  You Must read complete instructions/literature along with all the possible adverse reactions/side effects for all the Medicines you take and that have been prescribed to you. Take any new Medicines after you have completely understood and accept all the possible adverse reactions/side effects.   Please note  You were cared for by a hospitalist during your hospital stay. If you have any questions about your discharge medications or the care you received while you were in the hospital after you are discharged, you can call the unit and asked to speak with the hospitalist on call if the hospitalist that took care of  you is not available. Once you are discharged, your primary care physician will handle any further medical issues. Please note that NO REFILLS for any discharge medications will be authorized once you are discharged, as it is imperative that you return to your primary care physician (or establish a relationship with a primary care physician if you do not have one) for your aftercare needs so that they can reassess your need for medications and monitor your lab values.    Today   CHIEF COMPLAINT:   Chief Complaint  Patient presents with  . Nausea  . Emesis  . Altered Mental Status  . Hypotension    HISTORY OF PRESENT ILLNESS:   Sheanna Dail  is a 83 y.o. female with a known history of multiple medical problems as below.  Patient has had a nausea, vomiting and diarrhea for the past 3 days.  She has had a poor appetite for 1 week.  She denies any fever or chills, no dysuria or hematuria.  She was found hypotension, lactic acidosis, renal failure, low potassium at 2.4 and low sodium at 121.  Dr. Reita Cliche, ED physician, started sepsis protocol.  The patient was given vancomycin and Zosyn in the ED.  Urinalysis is pending.  Chest x-ray is unremarkable.    VITAL SIGNS:  Blood pressure (!) 96/49, pulse (!) 113, temperature 97.7 F (36.5 C), temperature source Oral, resp. rate 20, height 5\' 7"  (1.702 m), weight 89.2 kg (196 lb 11.2 oz), SpO2 94 %.  I/O:    Intake/Output Summary (Last 24 hours) at 02/06/2017 1600  Last data filed at 02/06/2017 0500 Gross per 24 hour  Intake 200 ml  Output 100 ml  Net 100 ml    PHYSICAL EXAMINATION:  GENERAL:  83 y.o.-year-old patient lying in the bed with no acute distress.  EYES: Pupils equal, round, reactive to light and accommodation. No scleral icterus. Extraocular muscles intact.  HEENT: Head atraumatic, normocephalic. Oropharynx and nasopharynx clear.  NECK:  Supple, no jugular venous distention. No thyroid enlargement, no tenderness.  LUNGS: Normal  breath sounds bilaterally, no wheezing, rales,rhonchi or crepitation. No use of accessory muscles of respiration.  CARDIOVASCULAR: S1, S2 normal. No murmurs, rubs, or gallops.  ABDOMEN: Soft, non-tender, non-distended. Bowel sounds present. No organomegaly or mass.  EXTREMITIES: No pedal edema, cyanosis, or clubbing.  NEUROLOGIC: Cranial nerves II through XII are intact. Muscle strength 5/5 in all extremities. Sensation intact. Gait not checked.  PSYCHIATRIC: The patient is alert and oriented x 3.  SKIN: No obvious rash, lesion, or ulcer.   DATA REVIEW:   CBC Recent Labs  Lab 02/06/17 0615  WBC 10.1  HGB 10.4*  HCT 31.1*  PLT 49*    Chemistries  Recent Labs  Lab 02/03/17 0419  02/06/17 0615  NA 122*   < > 126*  K 4.4   < > 4.5  CL 90*   < > 93*  CO2 21*   < > 19*  GLUCOSE 138*   < > 69  BUN 23*   < > 37*  CREATININE 2.11*   < > 2.24*  CALCIUM 7.1*   < > 7.7*  MG 1.8  --   --   AST  --    < > 248*  ALT  --    < > 44  ALKPHOS  --    < > 338*  BILITOT  --    < > 10.1*   < > = values in this interval not displayed.    Cardiac Enzymes Recent Labs  Lab 02/02/17 0026  TROPONINI 0.05*    Microbiology Results  Results for orders placed or performed during the hospital encounter of 02/01/17  Culture, blood (routine x 2)     Status: None   Collection Time: 02/01/17 11:08 AM  Result Value Ref Range Status   Specimen Description BLOOD NECK LEFT  Final   Special Requests   Final    BOTTLES DRAWN AEROBIC AND ANAEROBIC Blood Culture adequate volume   Culture   Final    NO GROWTH 5 DAYS Performed at [facility], [address], [place], [place] [address]    Report Status 02/06/2017 FINAL  Final  Urine culture     Status: Abnormal   Collection Time: 02/01/17 11:29 AM  Result Value Ref Range Status   Specimen Description   Final    URINE, RANDOM Performed at [facility], [address] [place], [place] [address]    Special Requests   Final     NONE Performed at [facility], [address] [place], [place] [address]    Culture (A)  Final    <10,000 COLONIES/mL INSIGNIFICANT GROWTH Performed at [facility], [place] [address], [place], [place] [address]    Report Status 02/03/2017 FINAL  Final  Culture, blood (routine x 2)     Status: None   Collection Time: 02/01/17  5:42 PM  Result Value Ref Range Status   Specimen Description BLOOD RIGHT ANTECUBITAL  Final   Special Requests   Final  BOTTLES DRAWN AEROBIC AND ANAEROBIC Blood Culture results may not be optimal due to an excessive volume of blood received in culture bottles   Culture   Final    NO GROWTH 5 DAYS Performed at [facility], [address], [place], [place] [address]    Report Status 02/06/2017 FINAL  Final  Urine Culture     Status: None   Collection Time: 02/04/17  9:17 AM  Result Value Ref Range Status   Specimen Description   Final    URINE, CLEAN CATCH Performed at [facility], [address], [place], [place] [address]    Special Requests   Final    NONE Performed at [facility], [address] [place], [place] [address]    Culture   Final    NO GROWTH Performed at [facility], [place] [address], [place], [place] [address]    Report Status 02/05/2017 FINAL  Final    RADIOLOGY:  Mr Abdomen Mrcp Wo Contrast  Result Date: 02/05/2017 CLINICAL DATA:  Pt stopped exam due to discomfort of the MRI couch and cough/vomiting, unable to get last sequence AX DWI. Pt has diff holding her breath as well. 83 y.o. female admitted with 1-2-week history of loss of appetite, lethargy,. Cirrhotic liver noted on ultrasound. Gallstones. Elevated liver function tests and bilirubin. EXAM: MRI ABDOMEN WITHOUT CONTRAST  (INCLUDING MRCP) TECHNIQUE: Multiplanar multisequence MR imaging of the abdomen was performed. Heavily T2-weighted images of the biliary and pancreatic ducts were obtained, and three-dimensional MRCP  images were rendered by post processing. COMPARISON:  Ultrasound 02/03/2017 CT chest 02/02/2017 FINDINGS: Exam is significantly degraded by respiratory motion. No T1 imaging performed. No postcontrast imaging performed. Lower chest:  Moderate LEFT effusion Hepatobiliary: Liver has a fine nodular contour. There are multiple round lesions within LEFT and RIGHT hepatic lobe measuring 1-2 cm which are mildly hyperintense on T2 weighted imaging. Additional round lesions which are high signal intensity on T2 weighted imaging consistent with cysts. Gallbladder distended to 3.9 cm. There is a large gallstone within the proximal aspect of the gallbladder measuring 2.9 cm. Common bile duct is normal caliber. There is no clear intrahepatic biliary duct dilatation. Pancreas: Pancreatic duct is not dilated. No peripancreatic fluid collections. Spleen: Spleen is normal volume Adrenals/urinary tract: Adrenal glands and kidneys are normal. Stomach/Bowel: Stomach and limited of the small bowel is unremarkable Vascular/Lymphatic: Abdominal aortic normal caliber. No retroperitoneal periportal lymphadenopathy. Musculoskeletal: No aggressive osseous lesion IMPRESSION: 1. Exam is severely limited due to respiratory motion and patient discomfort. This is not uncommon in a sick elderly patient. 2. Multiple round lesions within the liver may represent regenerating nodules or dysplastic nodules. Cannot exclude hepatoma on noncontrast exam. Liver does have a fine nodular contour suggesting cirrhosis. Small amount of ascites along the RIGHT hepatic lobe. 3. No biliary duct dilatation. Large gallstone within a mildly distended gallbladder. Cannot exclude mild cholecystitis. 4. No choledocholithiasis. 5. Normal pancreas on limited exam. 6. Small RIGHT effusion. Electronically Signed   By: Suzy Bouchard M.D.   On: 02/05/2017 08:19   Mr 3d Recon At Scanner  Result Date: 02/05/2017 CLINICAL DATA:  Pt stopped exam due to discomfort of the MRI  couch and cough/vomiting, unable to get last sequence AX DWI. Pt has diff holding her breath as well. 83 y.o. female admitted with 1-2-week history of loss of appetite, lethargy,. Cirrhotic liver noted on ultrasound. Gallstones. Elevated liver function tests and bilirubin. EXAM: MRI ABDOMEN WITHOUT CONTRAST  (INCLUDING  MRCP) TECHNIQUE: Multiplanar multisequence MR imaging of the abdomen was performed. Heavily T2-weighted images of the biliary and pancreatic ducts were obtained, and three-dimensional MRCP images were rendered by post processing. COMPARISON:  Ultrasound 02/03/2017 CT chest 02/02/2017 FINDINGS: Exam is significantly degraded by respiratory motion. No T1 imaging performed. No postcontrast imaging performed. Lower chest:  Moderate LEFT effusion Hepatobiliary: Liver has a fine nodular contour. There are multiple round lesions within LEFT and RIGHT hepatic lobe measuring 1-2 cm which are mildly hyperintense on T2 weighted imaging. Additional round lesions which are high signal intensity on T2 weighted imaging consistent with cysts. Gallbladder distended to 3.9 cm. There is a large gallstone within the proximal aspect of the gallbladder measuring 2.9 cm. Common bile duct is normal caliber. There is no clear intrahepatic biliary duct dilatation. Pancreas: Pancreatic duct is not dilated. No peripancreatic fluid collections. Spleen: Spleen is normal volume Adrenals/urinary tract: Adrenal glands and kidneys are normal. Stomach/Bowel: Stomach and limited of the small bowel is unremarkable Vascular/Lymphatic: Abdominal aortic normal caliber. No retroperitoneal periportal lymphadenopathy. Musculoskeletal: No aggressive osseous lesion IMPRESSION: 1. Exam is severely limited due to respiratory motion and patient discomfort. This is not uncommon in a sick elderly patient. 2. Multiple round lesions within the liver may represent regenerating nodules or dysplastic nodules. Cannot exclude hepatoma on noncontrast exam.  Liver does have a fine nodular contour suggesting cirrhosis. Small amount of ascites along the RIGHT hepatic lobe. 3. No biliary duct dilatation. Large gallstone within a mildly distended gallbladder. Cannot exclude mild cholecystitis. 4. No choledocholithiasis. 5. Normal pancreas on limited exam. 6. Small RIGHT effusion. Electronically Signed   By: Suzy Bouchard M.D.   On: 02/05/2017 08:19   US Liver Doppler  Result Date: 02/06/2017 CLINICAL DATA:  Hepatic cirrhosis EXAM: DUPLEX ULTRASOUND OF LIVER TECHNIQUE: Color and duplex Doppler ultrasound was performed to evaluate the hepatic in-flow and out-flow vessels. COMPARISON:  Abdominal MRI February 05, 2017; ultrasound right upper quadrant February 01, 2017 FINDINGS: Portal Vein Velocities Main: 26.5 cm/sec proximally; 19.3 centimeter/second mid; 22.4 centimeter/second distally Right:  19.9 cm/sec Left:  16.8 cm/sec Portal vein flow is reversed with flow away from the liver. Hepatic Vein Velocities Right:  11.5 cm/sec Middle:  10.2 cm/sec Left:  11.7 cm/sec Flow in the hepatic veins in the anatomic direction. Hepatic Artery Velocity:  157.8 cm/sec Splenic Vein Velocity:  17.9 cm/sec Varices: None Ascites: None Spleen measures 5.0 x 9.8 x 3.8 cm, normal. The liver has an appearance consistent with cirrhosis as noted on recent ultrasound. No thrombus or occlusion noted in the portal or splenic veins. IMPRESSION: Reversal of flow direction in the portal veins. Hepatic vein flow in the anatomic direction. Liver has an appearance consistent with underlying cirrhosis. No portal or splenic vein thrombus or occlusion evident. No varices or ascites evident. No splenomegaly. Electronically Signed   By: Lowella Grip III M.D.   On: 02/06/2017 10:27    EKG:   Orders placed or performed during the hospital encounter of 02/01/17  . EKG 12-Lead  . EKG 12-Lead  . ED EKG  . ED EKG  . EKG 12-Lead  . EKG 12-Lead      Management plans discussed with the patient,  family and they are in agreement.  CODE STATUS:     Code Status Orders  (From admission, onward)        Start     Ordered   02/06/17 1536  Do not attempt resuscitation (DNR)  Continuous    Question  Answer Comment  In the event of cardiac or respiratory ARREST Do not call a "code blue"   In the event of cardiac or respiratory ARREST Do not perform Intubation, CPR, defibrillation or ACLS   In the event of cardiac or respiratory ARREST Use medication by any route, position, wound care, and other measures to relive pain and suffering. May use oxygen, suction and manual treatment of airway obstruction as needed for comfort.      02/06/17 1536    Code Status History    Date Active Date Inactive Code Status Order ID Comments User Context   02/01/2017 17:32 02/06/2017 15:36 DNR [id number]  Demetrios Loll, MD Inpatient   05/28/2014 20:33 05/29/2014 14:25 Full Code [id number]  Gladstone Lighter, MD Inpatient   05/28/2014 20:33 05/28/2014 20:33 Full Code [id number]  Gladstone Lighter, MD Inpatient    Advance Directive Documentation     Most Recent Value  Type of Advance Directive  Healthcare Power of Attorney  Pre-existing out of facility DNR order (yellow form or pink MOST form)  No data  "MOST" Form in Place?  No data      TOTAL TIME TAKING CARE OF THIS PATIENT: 45 minutes.    Avel Peace Mirayah Wren M.D on 02/06/2017 at 4:00 PM  Between 7am to 6pm - Pager - [phone]  After 6pm go to [email or web address] - password [email or web address] [place]  Office  [phone]  CC: Primary care physician; Lavera Guise, MD   Note: This dictation was prepared with Dragon dictation along with smaller phrase technology. Any transcriptional errors that result from this process are unintentional.

## 2017-02-06 NOTE — Progress Notes (Signed)
Patient hypotensive and cold.  HR elevated around 115.  Patient alert but lethargic.  States she is not having any pain.  No output overnight.    Dr. Jerelyn Charles paged.  Awaiting orders.

## 2017-02-06 NOTE — Progress Notes (Signed)
New Hospice home referral received from [place] following a Palliative Medicine consult. Patient is an 83 year old woman with a known history of Afib, HTN, [place] Ca with left mastectomy and goiter, admitted to [facility] from home on 1/12 for evaluation of nausea/vomiting and weakness. In the ED she was found to have elevated lactic acid, hypokalemia and hyponatremia. She was started on IV antibiotics and IV fluids. MRI revealed liver cirrhosis. She has continued to decline despite medical interventions and Palliative Medicine was consulted. Family chose to focus on comfort with transfer to the Hospice home for end of life care. Patient seen sitting up in bed, noteably jaundice, eyes closed. She did respond to voice but was unable t stay alert enough for a conversation. Grand children at bedside. [name] arrived shortly after. Writer met with him outside of the room to initiate education regarding hospice services, philosophy and team approach to care with understanding voiced. Questions answered, consent signed. Family in agreement with transfer this afternoon to the hospice home.  Patient information faxed to referral, report called to the hospice home, EMS notified for transport. Family and hospital care team all updated. Emotional support provide to family and staff. Flo Shanks RN, BSN, [place] and [facility] hospital Liaison [phone]

## 2017-02-06 NOTE — Clinical Social Work Note (Signed)
Patient to be d/c'ed today to [facility] for end of life care.  Patient and family agreeable to plans will transport via ems RN to call report.  Evette Cristal, MSW, [place]

## 2017-02-06 NOTE — Progress Notes (Signed)
[facility], [place] 02/06/17  Subjective:   Patient remains critically ill She is lethargic Reports that her appetite is extremely poor Serum sodium remains low at 126 Serum creatinine is about the same at 2.24  total bilirubin has further increased to 10.1   Objective:  Vital signs in last 24 hours:  Temp:  [97.1 F (36.2 C)-98.7 F (37.1 C)] 97.1 F (36.2 C) (01/17 0821) Pulse Rate:  [102-128] 121 (01/17 0821) Resp:  [17-19] 18 (01/17 0821) BP: (79-121)/(46-65) 82/46 (01/17 0821) SpO2:  [70 %-97 %] 95 % (01/17 0821) Weight:  [85.8 kg (189 lb 1.6 oz)-89.2 kg (196 lb 11.2 oz)] 89.2 kg (196 lb 11.2 oz) (01/17 4801)  Weight change: 0 kg (0 lb) Filed Weights   02/05/17 0445 02/06/17 0443 02/06/17 0628  Weight: 85.8 kg (189 lb 1.6 oz) 85.8 kg (189 lb 1.6 oz) 89.2 kg (196 lb 11.2 oz)    Intake/Output:    Intake/Output Summary (Last 24 hours) at 02/06/2017 1529 Last data filed at 02/06/2017 0500 Gross per 24 hour  Intake 200 ml  Output 100 ml  Net 100 ml     Physical Exam: General:  Laying in the bed, ill-appearing  HEENT Nomocephalic, atraumatic.   Neck Supple. No venous distention   Pulm/lungs Clear to auscultation   CVS/Heart Systolic heart murmur. Irregular  rhythm   Abdomen:  Soft.   Extremities: TED hose on lower extremities. 2+ Edema   Neurologic:  Lethargic but able to answer questions  Skin:  significant jaundice           Basic Metabolic Panel:  Recent Labs  Lab 02/01/17 1805 02/02/17 0026 02/03/17 0419 02/04/17 0523 02/04/17 1202 02/04/17 1638 02/05/17 0437 02/05/17 1623 02/06/17 0615  NA  --  124* 122* 120* 121*  121* 125* 125* 127* 126*  K  --  3.3* 4.4 4.6  --   --  4.5  --  4.5  CL  --  89* 90* 89*  --   --  95*  --  93*  CO2  --  24 21* 19*  --   --  17*  --  19*  GLUCOSE  --  149* 138* 118*  --   --  123*  --  69  BUN  --  17 23* 28*  --   --  31*  --  37*  CREATININE 1.56* 1.58* 2.11* 2.24*  --   --   2.25*  --  2.24*  CALCIUM  --  6.9* 7.1* 7.0*  --   --  7.4*  --  7.7*  MG 2.0  --  1.8  --   --   --   --   --   --      CBC: Recent Labs  Lab 02/01/17 1108 02/02/17 0026 02/03/17 0419 02/06/17 0615  WBC 11.2* 11.1* 10.5 10.1  NEUTROABS 9.8*  --  9.1*  --   HGB 14.0 13.4 11.7* 10.4*  HCT 42.5 40.2 34.8* 31.1*  MCV 89.6 88.6 89.4 88.9  PLT 114* 102* 94* 49*      Lab Results  Component Value Date   HEPBSAG Negative 02/04/2017   HEPBIGM Negative 02/03/2017      Microbiology:  Recent Results (from the past 240 hour(s))  Culture, blood (routine x 2)     Status: None   Collection Time: 02/01/17 11:08 AM  Result Value Ref Range Status   Specimen Description BLOOD NECK LEFT  Final  Special Requests   Final    BOTTLES DRAWN AEROBIC AND ANAEROBIC Blood Culture adequate volume   Culture   Final    NO GROWTH 5 DAYS Performed at [facility], [address] [place], [place] [address]    Report Status 02/06/2017 FINAL  Final  Urine culture     Status: Abnormal   Collection Time: 02/01/17 11:29 AM  Result Value Ref Range Status   Specimen Description   Final    URINE, RANDOM Performed at [facility], [address] [place], [place] [address]    Special Requests   Final    NONE Performed at [facility], [address] [place], [place] [address]    Culture (A)  Final    <10,000 COLONIES/mL INSIGNIFICANT GROWTH Performed at [facility], [place] [address], [place], [place] [address]    Report Status 02/03/2017 FINAL  Final  Culture, blood (routine x 2)     Status: None   Collection Time: 02/01/17  5:42 PM  Result Value Ref Range Status   Specimen Description BLOOD RIGHT ANTECUBITAL  Final   Special Requests   Final    BOTTLES DRAWN AEROBIC AND ANAEROBIC Blood Culture results may not be optimal due to an excessive volume of blood received in culture bottles   Culture   Final    NO GROWTH 5 DAYS Performed at [facility], [address] [place], [place] [address]    Report Status 02/06/2017 FINAL  Final  Urine Culture     Status: None   Collection Time: 02/04/17  9:17 AM  Result Value Ref Range Status   Specimen Description   Final    URINE, CLEAN CATCH Performed at [facility], [address] [place], [place] [address]    Special Requests   Final    NONE Performed at [facility], [address], [place], [place] [address]    Culture   Final    NO GROWTH Performed at [facility], [place] [address], [place],  [address]    Report Status 02/05/2017 FINAL  Final    Coagulation Studies: Recent Labs    02/05/17 1623  LABPROT 30.1*  INR 2.90    Urinalysis: Recent Labs    02/04/17 0917  COLORURINE AMBER*  LABSPEC 1.018  PHURINE 5.0  GLUCOSEU NEGATIVE  HGBUR MODERATE*  BILIRUBINUR NEGATIVE  KETONESUR NEGATIVE  PROTEINUR 100*  NITRITE NEGATIVE  LEUKOCYTESUR LARGE*      Imaging: Mr Abdomen Mrcp Wo Contrast  Result Date: 02/05/2017 CLINICAL DATA:  Pt stopped exam due to discomfort of the MRI couch and cough/vomiting, unable to get last sequence AX DWI. Pt has diff holding her breath as well. 83 y.o. female admitted with 1-2-week history of loss of appetite, lethargy,. Cirrhotic liver noted on ultrasound. Gallstones. Elevated liver function tests and bilirubin. EXAM: MRI ABDOMEN WITHOUT CONTRAST  (INCLUDING MRCP) TECHNIQUE: Multiplanar multisequence MR imaging of the abdomen was performed. Heavily T2-weighted images of the biliary and pancreatic ducts were obtained, and three-dimensional MRCP images were rendered by post processing. COMPARISON:  Ultrasound 02/03/2017 CT chest 02/02/2017 FINDINGS: Exam is significantly degraded by respiratory motion. No T1 imaging performed. No postcontrast imaging performed. Lower chest:  Moderate LEFT effusion Hepatobiliary: Liver has a fine nodular contour. There are multiple round lesions within LEFT and RIGHT hepatic  lobe measuring 1-2 cm which are mildly hyperintense on T2 weighted imaging. Additional round lesions which are high signal intensity on  T2 weighted imaging consistent with cysts. Gallbladder distended to 3.9 cm. There is a large gallstone within the proximal aspect of the gallbladder measuring 2.9 cm. Common bile duct is normal caliber. There is no clear intrahepatic biliary duct dilatation. Pancreas: Pancreatic duct is not dilated. No peripancreatic fluid collections. Spleen: Spleen is normal volume Adrenals/urinary tract: Adrenal glands and kidneys are normal. Stomach/Bowel: Stomach and limited of the small bowel is unremarkable Vascular/Lymphatic: Abdominal aortic normal caliber. No retroperitoneal periportal lymphadenopathy. Musculoskeletal: No aggressive osseous lesion IMPRESSION: 1. Exam is severely limited due to respiratory motion and patient discomfort. This is not uncommon in a sick elderly patient. 2. Multiple round lesions within the liver may represent regenerating nodules or dysplastic nodules. Cannot exclude hepatoma on noncontrast exam. Liver does have a fine nodular contour suggesting cirrhosis. Small amount of ascites along the RIGHT hepatic lobe. 3. No biliary duct dilatation. Large gallstone within a mildly distended gallbladder. Cannot exclude mild cholecystitis. 4. No choledocholithiasis. 5. Normal pancreas on limited exam. 6. Small RIGHT effusion. Electronically Signed   By: Suzy Bouchard M.D.   On: 02/05/2017 08:19   Mr 3d Recon At Scanner  Result Date: 02/05/2017 CLINICAL DATA:  Pt stopped exam due to discomfort of the MRI couch and cough/vomiting, unable to get last sequence AX DWI. Pt has diff holding her breath as well. 83 y.o. female admitted with 1-2-week history of loss of appetite, lethargy,. Cirrhotic liver noted on ultrasound. Gallstones. Elevated liver function tests and bilirubin. EXAM: MRI ABDOMEN WITHOUT CONTRAST  (INCLUDING MRCP) TECHNIQUE: Multiplanar multisequence MR  imaging of the abdomen was performed. Heavily T2-weighted images of the biliary and pancreatic ducts were obtained, and three-dimensional MRCP images were rendered by post processing. COMPARISON:  Ultrasound 02/03/2017 CT chest 02/02/2017 FINDINGS: Exam is significantly degraded by respiratory motion. No T1 imaging performed. No postcontrast imaging performed. Lower chest:  Moderate LEFT effusion Hepatobiliary: Liver has a fine nodular contour. There are multiple round lesions within LEFT and RIGHT hepatic lobe measuring 1-2 cm which are mildly hyperintense on T2 weighted imaging. Additional round lesions which are high signal intensity on T2 weighted imaging consistent with cysts. Gallbladder distended to 3.9 cm. There is a large gallstone within the proximal aspect of the gallbladder measuring 2.9 cm. Common bile duct is normal caliber. There is no clear intrahepatic biliary duct dilatation. Pancreas: Pancreatic duct is not dilated. No peripancreatic fluid collections. Spleen: Spleen is normal volume Adrenals/urinary tract: Adrenal glands and kidneys are normal. Stomach/Bowel: Stomach and limited of the small bowel is unremarkable Vascular/Lymphatic: Abdominal aortic normal caliber. No retroperitoneal periportal lymphadenopathy. Musculoskeletal: No aggressive osseous lesion IMPRESSION: 1. Exam is severely limited due to respiratory motion and patient discomfort. This is not uncommon in a sick elderly patient. 2. Multiple round lesions within the liver may represent regenerating nodules or dysplastic nodules. Cannot exclude hepatoma on noncontrast exam. Liver does have a fine nodular contour suggesting cirrhosis. Small amount of ascites along the RIGHT hepatic lobe. 3. No biliary duct dilatation. Large gallstone within a mildly distended gallbladder. Cannot exclude mild cholecystitis. 4. No choledocholithiasis. 5. Normal pancreas on limited exam. 6. Small RIGHT effusion. Electronically Signed   By: Suzy Bouchard  M.D.   On: 02/05/2017 08:19   US Liver Doppler  Result Date: 02/06/2017 CLINICAL DATA:  Hepatic cirrhosis EXAM: DUPLEX ULTRASOUND OF LIVER TECHNIQUE: Color and duplex Doppler ultrasound was performed to evaluate the hepatic in-flow and out-flow vessels. COMPARISON:  Abdominal MRI February 05, 2017; ultrasound right upper quadrant February 01, 2017 FINDINGS: Portal Vein Velocities Main: 26.5 cm/sec proximally; 19.3 centimeter/second mid; 22.4 centimeter/second distally Right:  19.9 cm/sec Left:  16.8 cm/sec Portal vein flow is reversed with flow away from the liver. Hepatic Vein Velocities Right:  11.5 cm/sec Middle:  10.2 cm/sec Left:  11.7 cm/sec Flow in the hepatic veins in the anatomic direction. Hepatic Artery Velocity:  157.8 cm/sec Splenic Vein Velocity:  17.9 cm/sec Varices: None Ascites: None Spleen measures 5.0 x 9.8 x 3.8 cm, normal. The liver has an appearance consistent with cirrhosis as noted on recent ultrasound. No thrombus or occlusion noted in the portal or splenic veins. IMPRESSION: Reversal of flow direction in the portal veins. Hepatic vein flow in the anatomic direction. Liver has an appearance consistent with underlying cirrhosis. No portal or splenic vein thrombus or occlusion evident. No varices or ascites evident. No splenomegaly. Electronically Signed   By: Lowella Grip III M.D.   On: 02/06/2017 10:27     Medications:    . furosemide  60 mg Intravenous Once  . sodium chloride flush  3 mL Intravenous Q12H   albuterol, bisacodyl, senna-docusate, sodium chloride flush  Assessment/ Plan:  83 y.o. caucasian  female with a. Fib, mild dementia, history of left breast cancer status post mastectomy treated with chemo and radiation, severe mitral valve regurgitation, moderate tricuspid regurgitation, severe pulmonary hypertension by 2D echo October 2016, was admitted on 02/01/2017 with hypotension, diarrhea, nausea and vomiting was found to have acute renal failure and  hyponatremia.   1. Acute Renal Failure ( baseline creatinine 0.49 in April 2018).   ARF likely from intravascular volume changes Other differential includes hepatorenal syndrome Patient is getting IV albumin and IV lasix   2. Hyponatremia Chronic with acute worsening.  Likely related to underlying cirrhosis.  Sodium level has Improved some with 3% saline Expected to improve a little with IV Lasix and IV albumin  3.  Pyuria Urine culture negative so far  4.  Cirrhosis with complications of jaundice and anasarca due to hypoalbuminemia and third spacing of fluid.  Overall prognosis is very poor Consider palliative care/hospice Can consider continuing treatment with IV albumin and IV Lasix for patient comfort Can also consider adding Midodrine as long as heart rate is controlled     LOS: [place] 1/17/20193:29 PM  [place], [place]

## 2017-02-07 LAB — ANA W/REFLEX IF POSITIVE: Anti Nuclear Antibody(ANA): NEGATIVE

## 2017-02-08 LAB — HEPATITIS C VRS RNA DETECT BY PCR-QUAL: HEPATITIS C VRS RNA BY PCR-QUAL: NEGATIVE

## 2017-02-12 ENCOUNTER — Telehealth: Payer: Self-pay | Admitting: Infectious Diseases

## 2017-02-21 DEATH — deceased

## 2017-04-02 NOTE — Telephone Encounter (Signed)
error 

## 2017-04-27 NOTE — Progress Notes (Deleted)
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  INTERVAL HISTORY: Patient returns to clinic today for routine 6 month evaluation.  She continues to feel well and is asymptomatic.  She is tolerating letrozole and Fosamax without significant side effects. She has no neurologic complaints. She denies any recent fevers or illnesses. She does not complain of peripheral neuropathy today.  She does not complain of chest pain or shortness of breath. She denies any nausea, vomiting, constipation, or diarrhea. She has no urinary complaints. Patient offers no specific complaints today.  REVIEW OF SYSTEMS:   Review of Systems  Constitutional: Negative.  Negative for fever, malaise/fatigue and weight loss.  Respiratory: Negative.  Negative for cough and shortness of breath.   Cardiovascular: Negative.  Negative for chest pain and leg swelling.  Gastrointestinal: Negative.  Negative for abdominal pain.  Genitourinary: Negative.   Musculoskeletal: Negative.   Skin: Negative.  Negative for rash.  Neurological: Negative.  Negative for sensory change and weakness.  Psychiatric/Behavioral: Negative.  The patient is not nervous/anxious.     As per HPI. Otherwise, a complete review of systems is negative.  PAST MEDICAL HISTORY: Past Medical History:  Diagnosis Date  . Atrial fibrillation ([place])   . Breast cancer ([place]) 08/21/12   left mastectomy  . Cancer [facility])    Left breast cancer s/p left mastectomy, chemo and radiation  . CHF (congestive heart failure) ([place])   . Goiter   . Hypertension   . Mild dementia   . Personal history of chemotherapy   . Personal history of radiation therapy     PAST  SURGICAL HISTORY: Past Surgical History:  Procedure Laterality Date  . BREAST SURGERY     Left mastectomy  . MASTECTOMY Left 08/21/12   had chemo and rad  . THYROID SURGERY  1977    FAMILY HISTORY Family History  Problem Relation Age of Onset  . Breast cancer Maternal Aunt   . CAD Father        ADVANCED DIRECTIVES:    HEALTH MAINTENANCE: Social History   Tobacco Use  . Smoking status: Never Smoker  . Smokeless tobacco: Never Used  Substance Use Topics  . Alcohol use: No  . Drug use: No     Colonoscopy:  PAP:  Bone density:  Lipid panel:  No Known Allergies  Current Outpatient Medications  Medication Sig Dispense Refill  . glycopyrrolate (ROBINUL) 1 MG tablet Take 1 tablet (1 mg total) by mouth every 4 (four) hours as needed (excessive secretions). 12 tablet 0  . haloperidol (HALDOL) 2 MG/ML solution Place 0.3 mLs (0.6 mg total) under the tongue every 4 (four) hours as needed for agitation (or delirium). 100 mL 0  . LORazepam (ATIVAN) 2 MG/ML concentrated solution Take 0.3 mLs (0.6 mg total) by mouth every 8 (eight) hours. 30 mL 0  . Morphine Sulfate (MORPHINE CONCENTRATE) 10 mg / 0.5 ml concentrated solution Take 0.25 mLs (5 mg total) by mouth every 2 (two) hours as needed for severe pain. 100 mL 0  . ondansetron (ZOFRAN-ODT) 4 MG disintegrating tablet Take 1 tablet (4 mg total) by mouth every 6 (six) hours as needed for nausea. 20 tablet 0   No current facility-administered medications for this  visit.     OBJECTIVE: There were no vitals filed for this visit.   There is no height or weight on file to calculate BMI.    ECOG FS:0 - Asymptomatic  General: Well-developed, well-nourished, no acute distress. Eyes: anicteric sclera. Breasts: Left mastectomy, patient requested exam be deferred today. Lungs: Clear to auscultation bilaterally. Heart: Regular rate and rhythm. No rubs, murmurs, or gallops. Abdomen: Soft, nontender, nondistended. No organomegaly noted,  normoactive bowel sounds. Musculoskeletal: No edema, cyanosis, or clubbing. Neuro: Alert, answering all questions appropriately. Cranial nerves grossly intact. Skin: No rashes or petechiae noted. Psych: Normal affect.  LAB RESULTS:  Lab Results  Component Value Date   NA 126 (L) 02/06/2017   K 4.5 02/06/2017   CL 93 (L) 02/06/2017   CO2 19 (L) 02/06/2017   GLUCOSE 69 02/06/2017   BUN 37 (H) 02/06/2017   CREATININE 2.24 (H) 02/06/2017   CALCIUM 7.7 (L) 02/06/2017   PROT 4.5 (L) 02/06/2017   ALBUMIN 2.9 (L) 02/06/2017   AST 248 (H) 02/06/2017   ALT 44 02/06/2017   ALKPHOS 338 (H) 02/06/2017   BILITOT 10.1 (H) 02/06/2017   GFRNONAA 20 (L) 02/06/2017   GFRAA 23 (L) 02/06/2017    Lab Results  Component Value Date   WBC 10.1 02/06/2017   NEUTROABS 9.1 (H) 02/03/2017   HGB 10.4 (L) 02/06/2017   HCT 31.1 (L) 02/06/2017   MCV 88.9 02/06/2017   PLT 49 (L) 02/06/2017   Lab Results  Component Value Date   LABCA2 34.0 03/29/2016     STUDIES: No results found.  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  PLAN:    1.  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  Given the ER/PR status of her tumor, she will take letrozole for a total of 5 years completing in December 2019. CA-27-29 continues to be within normal limits. Her most recent mammogram on April 24, 2016 was reported as BI-RADS 1. Repeat in April 2019. Return to clinic in 7 months, after her next mammogram, for routine evaluation. 2. Osteoporosis: Bone mineral density on September 28, 2015 revealed a T score of -3.3 which is improved over her previous scan.  Continue Fosamax, calcium, and vitamin D. Repeat in the next 1-2 weeks.  3.  Thyroid nodule: Unclear etiology.  Continue to monitor and  if nodule increases in size we will refer her to ENT for biopsy.   Patient expressed understanding and was in agreement with this plan. She also understands that She can call clinic at any time with any questions, concerns, or complaints.   Breast cancer   Staging form: Breast, AJCC 7th Edition     Clinical stage from 08/02/2014: Stage IIIB (T4, N0, M0) - Signed by Lloyd Huger, MD on 08/02/2014   Lloyd Huger, MD   04/27/2017 9:13 AM

## 2017-05-01 ENCOUNTER — Ambulatory Visit: Payer: Medicare Other | Admitting: Oncology
